# Patient Record
Sex: Female | Born: 1935 | Race: White | Hispanic: No | Smoking: Former smoker
Health system: Southern US, Community
[De-identification: ages and names within clinical notes are randomized; demographics above are authoritative.]

## PROBLEM LIST (undated history)

## (undated) DIAGNOSIS — R51 Headache: Secondary | ICD-10-CM

## (undated) DIAGNOSIS — N189 Chronic kidney disease, unspecified: Secondary | ICD-10-CM

## (undated) DIAGNOSIS — M1990 Unspecified osteoarthritis, unspecified site: Secondary | ICD-10-CM

## (undated) DIAGNOSIS — N289 Disorder of kidney and ureter, unspecified: Secondary | ICD-10-CM

## (undated) DIAGNOSIS — I499 Cardiac arrhythmia, unspecified: Secondary | ICD-10-CM

## (undated) DIAGNOSIS — C801 Malignant (primary) neoplasm, unspecified: Secondary | ICD-10-CM

## (undated) DIAGNOSIS — M65841 Other synovitis and tenosynovitis, right hand: Secondary | ICD-10-CM

## (undated) DIAGNOSIS — K219 Gastro-esophageal reflux disease without esophagitis: Secondary | ICD-10-CM

## (undated) DIAGNOSIS — R519 Headache, unspecified: Secondary | ICD-10-CM

## (undated) HISTORY — DX: Gastro-esophageal reflux disease without esophagitis: K21.9

## (undated) HISTORY — PX: JOINT REPLACEMENT: SHX530

## (undated) HISTORY — DX: Chronic kidney disease, unspecified: N18.9

## (undated) HISTORY — DX: Malignant (primary) neoplasm, unspecified: C80.1

## (undated) HISTORY — DX: Disorder of kidney and ureter, unspecified: N28.9

## (undated) HISTORY — PX: TOTAL HIP ARTHROPLASTY: SHX124

## (undated) HISTORY — PX: TONSILLECTOMY: SUR1361

## (undated) HISTORY — PX: MOUTH SURGERY: SHX715

---

## 2004-07-24 ENCOUNTER — Ambulatory Visit: Payer: Self-pay | Admitting: Family Medicine

## 2004-07-25 ENCOUNTER — Ambulatory Visit: Payer: Self-pay | Admitting: Family Medicine

## 2004-07-29 ENCOUNTER — Ambulatory Visit: Payer: Self-pay | Admitting: Family Medicine

## 2004-07-31 ENCOUNTER — Ambulatory Visit: Payer: Self-pay | Admitting: Family Medicine

## 2004-09-02 ENCOUNTER — Ambulatory Visit: Payer: Self-pay | Admitting: Family Medicine

## 2004-09-13 ENCOUNTER — Ambulatory Visit: Payer: Self-pay | Admitting: Critical Care Medicine

## 2004-09-18 ENCOUNTER — Ambulatory Visit: Payer: Self-pay | Admitting: Critical Care Medicine

## 2004-09-18 ENCOUNTER — Ambulatory Visit: Admission: RE | Admit: 2004-09-18 | Discharge: 2004-09-18 | Payer: Self-pay | Admitting: Critical Care Medicine

## 2004-09-20 ENCOUNTER — Ambulatory Visit: Payer: Self-pay | Admitting: Critical Care Medicine

## 2004-09-23 ENCOUNTER — Ambulatory Visit: Payer: Self-pay | Admitting: Pulmonary Disease

## 2004-09-26 ENCOUNTER — Ambulatory Visit: Payer: Self-pay | Admitting: Critical Care Medicine

## 2004-09-30 ENCOUNTER — Ambulatory Visit: Payer: Self-pay | Admitting: Critical Care Medicine

## 2004-11-15 ENCOUNTER — Ambulatory Visit: Payer: Self-pay | Admitting: Critical Care Medicine

## 2004-12-03 ENCOUNTER — Ambulatory Visit: Payer: Self-pay | Admitting: Family Medicine

## 2006-01-15 ENCOUNTER — Ambulatory Visit: Payer: Self-pay | Admitting: Family Medicine

## 2006-02-11 ENCOUNTER — Ambulatory Visit: Payer: Self-pay | Admitting: Family Medicine

## 2006-02-18 ENCOUNTER — Ambulatory Visit: Payer: Self-pay | Admitting: Family Medicine

## 2006-03-10 ENCOUNTER — Ambulatory Visit: Payer: Self-pay | Admitting: Family Medicine

## 2006-04-08 ENCOUNTER — Ambulatory Visit: Payer: Self-pay | Admitting: Family Medicine

## 2006-12-31 ENCOUNTER — Ambulatory Visit: Payer: Self-pay | Admitting: Family Medicine

## 2015-09-16 DIAGNOSIS — I499 Cardiac arrhythmia, unspecified: Secondary | ICD-10-CM

## 2015-09-16 HISTORY — DX: Cardiac arrhythmia, unspecified: I49.9

## 2016-01-31 DIAGNOSIS — M1990 Unspecified osteoarthritis, unspecified site: Secondary | ICD-10-CM | POA: Insufficient documentation

## 2016-07-10 ENCOUNTER — Other Ambulatory Visit: Payer: Self-pay | Admitting: Orthopedic Surgery

## 2016-07-11 ENCOUNTER — Encounter (HOSPITAL_BASED_OUTPATIENT_CLINIC_OR_DEPARTMENT_OTHER): Payer: Self-pay | Admitting: *Deleted

## 2016-07-11 ENCOUNTER — Encounter (HOSPITAL_COMMUNITY)
Admission: RE | Admit: 2016-07-11 | Discharge: 2016-07-11 | Disposition: A | Discharge status: Home / Self Care | Payer: Medicare Other | Source: Ambulatory Visit | Attending: Orthopedic Surgery | Admitting: Orthopedic Surgery

## 2016-07-14 NOTE — Progress Notes (Signed)
ekg reviewed by Dr Royce Macadamia no further treatment needed

## 2016-07-15 ENCOUNTER — Ambulatory Visit (HOSPITAL_BASED_OUTPATIENT_CLINIC_OR_DEPARTMENT_OTHER)
Admission: RE | Admit: 2016-07-15 | Discharge: 2016-07-15 | Disposition: A | Discharge status: Home / Self Care | Payer: Medicare Other | Source: Ambulatory Visit | Attending: Orthopedic Surgery | Admitting: Orthopedic Surgery

## 2016-07-15 ENCOUNTER — Ambulatory Visit (HOSPITAL_BASED_OUTPATIENT_CLINIC_OR_DEPARTMENT_OTHER): Payer: Medicare Other | Admitting: Certified Registered"

## 2016-07-15 ENCOUNTER — Encounter (HOSPITAL_BASED_OUTPATIENT_CLINIC_OR_DEPARTMENT_OTHER)
Admission: RE | Disposition: A | Discharge status: Home / Self Care | Payer: Self-pay | Source: Ambulatory Visit | Attending: Orthopedic Surgery

## 2016-07-15 ENCOUNTER — Encounter (HOSPITAL_BASED_OUTPATIENT_CLINIC_OR_DEPARTMENT_OTHER): Payer: Self-pay | Admitting: Anesthesiology

## 2016-07-15 DIAGNOSIS — M65341 Trigger finger, right ring finger: Secondary | ICD-10-CM | POA: Diagnosis not present

## 2016-07-15 HISTORY — DX: Headache: R51

## 2016-07-15 HISTORY — DX: Cardiac arrhythmia, unspecified: I49.9

## 2016-07-15 HISTORY — DX: Unspecified osteoarthritis, unspecified site: M19.90

## 2016-07-15 HISTORY — DX: Other synovitis and tenosynovitis, right hand: M65.841

## 2016-07-15 HISTORY — DX: Headache, unspecified: R51.9

## 2016-07-15 HISTORY — PX: TRIGGER FINGER RELEASE: SHX641

## 2016-07-15 SURGERY — RELEASE, A1 PULLEY, FOR TRIGGER FINGER
Anesthesia: Monitor Anesthesia Care | Site: Finger | Laterality: Right

## 2016-07-15 MED ORDER — ONDANSETRON HCL 4 MG/2ML IJ SOLN
INTRAMUSCULAR | Status: DC | PRN
  Administered 2016-07-15: 4 mg via INTRAVENOUS

## 2016-07-15 MED ORDER — CEFAZOLIN SODIUM-DEXTROSE 2-4 GM/100ML-% IV SOLN
2.0000 g | INTRAVENOUS | Status: AC
  Administered 2016-07-15: 2 g via INTRAVENOUS

## 2016-07-15 MED ORDER — MIDAZOLAM HCL 2 MG/2ML IJ SOLN
1.0000 mg | INTRAMUSCULAR | Status: DC | PRN
  Administered 2016-07-15: 1 mg via INTRAVENOUS

## 2016-07-15 MED ORDER — CHLORHEXIDINE GLUCONATE 4 % EX LIQD: 60.0000 mL | Freq: Once | CUTANEOUS | Status: DC

## 2016-07-15 MED ORDER — LACTATED RINGERS IV SOLN
INTRAVENOUS | Status: DC
  Administered 2016-07-15: 13:00:00 via INTRAVENOUS

## 2016-07-15 MED ORDER — LIDOCAINE HCL (PF) 0.5 % IJ SOLN
INTRAMUSCULAR | Status: DC | PRN
  Administered 2016-07-15: 25 mL via INTRAVENOUS

## 2016-07-15 MED ORDER — BUPIVACAINE HCL (PF) 0.5 % IJ SOLN
INTRAMUSCULAR | Status: DC | PRN
  Administered 2016-07-15: 2.5 mL

## 2016-07-15 MED ORDER — FENTANYL CITRATE (PF) 100 MCG/2ML IJ SOLN
50.0000 ug | INTRAMUSCULAR | Status: DC | PRN
  Administered 2016-07-15: 50 ug via INTRAVENOUS

## 2016-07-15 MED ORDER — GLYCOPYRROLATE 0.2 MG/ML IJ SOLN: 0.2000 mg | Freq: Once | INTRAMUSCULAR | Status: DC | PRN

## 2016-07-15 MED ORDER — FENTANYL CITRATE (PF) 100 MCG/2ML IJ SOLN: 25.0000 ug | INTRAMUSCULAR | Status: DC | PRN

## 2016-07-15 MED ORDER — HYDROCODONE-ACETAMINOPHEN 5-325 MG PO TABS
1.0000 | ORAL_TABLET | Freq: Four times a day (QID) | ORAL | 0 refills | Status: DC | PRN
Start: 2016-07-15 — End: 2020-01-26

## 2016-07-15 MED ORDER — MEPERIDINE HCL 25 MG/ML IJ SOLN: 6.2500 mg | INTRAMUSCULAR | Status: DC | PRN

## 2016-07-15 MED ORDER — FENTANYL CITRATE (PF) 100 MCG/2ML IJ SOLN
INTRAMUSCULAR | Status: AC
  Filled 2016-07-15: qty 2

## 2016-07-15 MED ORDER — SCOPOLAMINE 1 MG/3DAYS TD PT72: 1.0000 | MEDICATED_PATCH | Freq: Once | TRANSDERMAL | Status: DC | PRN

## 2016-07-15 MED ORDER — MIDAZOLAM HCL 2 MG/2ML IJ SOLN
INTRAMUSCULAR | Status: AC
  Filled 2016-07-15: qty 2

## 2016-07-15 MED ORDER — PROPOFOL 10 MG/ML IV BOLUS
INTRAVENOUS | Status: DC | PRN
  Administered 2016-07-15: 20 mg via INTRAVENOUS

## 2016-07-15 SURGICAL SUPPLY — 34 items
BANDAGE COBAN STERILE 2 (GAUZE/BANDAGES/DRESSINGS) ×3 IMPLANT
BLADE SURG 15 STRL LF DISP TIS (BLADE) ×1 IMPLANT
BLADE SURG 15 STRL SS (BLADE) ×3
BNDG CMPR 9X4 STRL LF SNTH (GAUZE/BANDAGES/DRESSINGS)
BNDG ESMARK 4X9 LF (GAUZE/BANDAGES/DRESSINGS) IMPLANT
CHLORAPREP W/TINT 26ML (MISCELLANEOUS) ×3 IMPLANT
CORDS BIPOLAR (ELECTRODE) IMPLANT
COVER BACK TABLE 60X90IN (DRAPES) ×3 IMPLANT
COVER MAYO STAND STRL (DRAPES) ×3 IMPLANT
CUFF TOURNIQUET SINGLE 18IN (TOURNIQUET CUFF) ×3 IMPLANT
DECANTER SPIKE VIAL GLASS SM (MISCELLANEOUS) IMPLANT
DRAPE EXTREMITY T 121X128X90 (DRAPE) ×3 IMPLANT
DRAPE SURG 17X23 STRL (DRAPES) ×3 IMPLANT
GAUZE SPONGE 4X4 12PLY STRL (GAUZE/BANDAGES/DRESSINGS) ×3 IMPLANT
GAUZE XEROFORM 1X8 LF (GAUZE/BANDAGES/DRESSINGS) ×3 IMPLANT
GLOVE BIO SURGEON STRL SZ7.5 (GLOVE) ×3 IMPLANT
GLOVE BIOGEL PI IND STRL 8 (GLOVE) ×1 IMPLANT
GLOVE BIOGEL PI IND STRL 8.5 (GLOVE) ×1 IMPLANT
GLOVE BIOGEL PI INDICATOR 8 (GLOVE) ×2
GLOVE BIOGEL PI INDICATOR 8.5 (GLOVE) ×2
GLOVE SURG ORTHO 8.0 STRL STRW (GLOVE) ×3 IMPLANT
GOWN STRL REUS W/ TWL LRG LVL3 (GOWN DISPOSABLE) ×1 IMPLANT
GOWN STRL REUS W/TWL 2XL LVL3 (GOWN DISPOSABLE) ×2 IMPLANT
GOWN STRL REUS W/TWL LRG LVL3 (GOWN DISPOSABLE)
GOWN STRL REUS W/TWL XL LVL3 (GOWN DISPOSABLE) ×3 IMPLANT
NEEDLE PRECISIONGLIDE 27X1.5 (NEEDLE) ×3 IMPLANT
NS IRRIG 1000ML POUR BTL (IV SOLUTION) ×3 IMPLANT
PACK BASIN DAY SURGERY FS (CUSTOM PROCEDURE TRAY) ×3 IMPLANT
STOCKINETTE 4X48 STRL (DRAPES) ×3 IMPLANT
SUT ETHILON 4 0 PS 2 18 (SUTURE) ×3 IMPLANT
SYR BULB 3OZ (MISCELLANEOUS) ×3 IMPLANT
SYR CONTROL 10ML LL (SYRINGE) ×3 IMPLANT
TOWEL OR 17X24 6PK STRL BLUE (TOWEL DISPOSABLE) ×6 IMPLANT
UNDERPAD 30X30 (UNDERPADS AND DIAPERS) ×3 IMPLANT

## 2016-07-15 NOTE — Anesthesia Postprocedure Evaluation (Signed)
Anesthesia Post Note  Patient: Loretta Miles  Procedure(s) Performed: Procedure(s) (LRB): RELEASE TRIGGER FINGER/A-1 PULLEY RIGHT RING FINGER (Right)  Patient location during evaluation: PACU Anesthesia Type: MAC Level of consciousness: awake and alert Pain management: pain level controlled Vital Signs Assessment: post-procedure vital signs reviewed and stable Respiratory status: spontaneous breathing, nonlabored ventilation and respiratory function stable Cardiovascular status: stable and blood pressure returned to baseline Anesthetic complications: no    Last Vitals:  Vitals:   07/15/16 1400 07/15/16 1430  BP: (!) 116/99 (!) 164/82  Pulse: 65 75  Resp: 16 16  Temp:  36.4 C    Last Pain:  Vitals:   07/15/16 1430  TempSrc:   PainSc: 0-No pain                 Janaria Mccammon A

## 2016-07-15 NOTE — H&P (Signed)
Loretta Miles is an 82 y.o. female.   Chief Complaint: catching right ring finger HPI: Loretta Miles is an 82 year old right hand dominant female referred from Dr Edrick Oh for consultation with pain in her right ring finger catching. This has been going on for the past six years, getting increasingly worse with VAS score 10/10. Sharp pain is noted in the palm. She has tried coconut oil, which gave her some relief. She states nothing seems to make it better or worse. She has no history of diabetes, thyroid problems. She does have a history of arthritis. There is no history of gout.This has been injected without resolution.  FAMILY HISTORY: Negative for diabetes, positive for possible thyroid problems. Negative for arthritis and negative for gout. She complains that she has pain at the carpometacarpal joint of her thumb.  History reviewed. No pertinent past medical history.  History reviewed. No pertinent surgical history.      Past Medical History:  Diagnosis Date  . Arthritis    rt hip, lt knee  . Dysrhythmia 2017   episode of RAF  . Headache    rare migraines  . 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 TONSILLECTOMY      History reviewed. No pertinent family history. Social History:  reports that she has never smoked. She has never used smokeless tobacco. She reports that she does not drink alcohol or use drugs.  Allergies:  Allergies  Allergen Reactions  . Antihistamines, Chlorpheniramine-Type Other (See Comments)    BP went up  . Prednisone Other (See Comments)    Caused "me to be crazy"  . Tamiflu [Oseltamivir] Other (See Comments)    Caused heart to be irreg, bp to go up    No prescriptions prior to admission.    No results found for this or any previous visit (from the past 48 hour(s)).  No results found.   Pertinent items are noted in HPI.  Height 5\' 6"  (1.676 m), weight 68 kg (150  lb).  General appearance: alert, cooperative and appears stated age Head: Normocephalic, without obvious abnormality Neck: no JVD Resp: clear to auscultation bilaterally Cardio: regular rate and rhythm, S1, S2 normal, no murmur, click, rub or gallop GI: soft, non-tender; bowel sounds normal; no masses,  no organomegaly Extremities: catching right ring finger Pulses: 2+ and symmetric Skin: Skin color, texture, turgor normal. No rashes or lesions Neurologic: Grossly normal Incision/Wound: na  Assessment/Plan Trigger finger right ring finger Plan A-1 pulley release right ring finger  George Haggart R 07/15/2016, 11:06 AM

## 2016-07-15 NOTE — Brief Op Note (Signed)
07/15/2016  1:42 PM  PATIENT:  Loretta Miles  82 y.o. female  PRE-OPERATIVE DIAGNOSIS:  stenosing tenosynovitis right ring finger #M65.341  POST-OPERATIVE DIAGNOSIS:  stenosing tenosynovitis right ring finger #M65.341  PROCEDURE:  Procedure(s): RELEASE TRIGGER FINGER/A-1 PULLEY RIGHT RING FINGER (Right)  SURGEON:  Surgeon(s) and Role:    * Daryll Brod, MD - Primary  PHYSICIAN ASSISTANT:   ASSISTANTS: none   ANESTHESIA:   local and regional  EBL:  Total I/O In: 700 [I.V.:700] Out: 0   BLOOD ADMINISTERED:none  DRAINS: none   LOCAL MEDICATIONS USED:  BUPIVICAINE   SPECIMEN:  No Specimen  DISPOSITION OF SPECIMEN:  N/A  COUNTS:  YES  TOURNIQUET:   Total Tourniquet Time Documented: Forearm (Right) - 19 minutes Total: Forearm (Right) - 19 minutes   DICTATION: .Other Dictation: Dictation Number [id number]  PLAN OF CARE: Discharge to home after PACU  PATIENT DISPOSITION:  PACU - hemodynamically stable.

## 2016-07-15 NOTE — Anesthesia Preprocedure Evaluation (Signed)
Anesthesia Evaluation  Patient identified by MRN, date of birth, ID band Patient awake    Reviewed: Allergy & Precautions, NPO status , Patient's Chart, lab work & pertinent test results  Airway Mallampati: I  TM Distance: >3 FB Neck ROM: Full    Dental  (+) Teeth Intact, Upper Dentures, Dental Advisory Given   Pulmonary    breath sounds clear to auscultation       Cardiovascular  Rhythm:Regular Rate:Normal     Neuro/Psych    GI/Hepatic   Endo/Other    Renal/GU      Musculoskeletal   Abdominal   Peds  Hematology   Anesthesia Other Findings   Reproductive/Obstetrics                             Anesthesia Physical Anesthesia Plan  ASA: I  Anesthesia Plan: MAC and Bier Block   Post-op Pain Management:    Induction: Intravenous  Airway Management Planned: Simple Face Mask  Additional Equipment:   Intra-op Plan:   Post-operative Plan:   Informed Consent: I have reviewed the patients History and Physical, chart, labs and discussed the procedure including the risks, benefits and alternatives for the proposed anesthesia with the patient or authorized representative who has indicated his/her understanding and acceptance.   Dental advisory given  Plan Discussed with: CRNA, Anesthesiologist and Surgeon  Anesthesia Plan Comments:         Anesthesia Quick Evaluation

## 2016-07-15 NOTE — Discharge Instructions (Addendum)

## 2016-07-15 NOTE — Transfer of Care (Signed)
Immediate Anesthesia Transfer of Care Note  Patient: Loretta Miles  Procedure(s) Performed: Procedure(s): RELEASE TRIGGER FINGER/A-1 PULLEY RIGHT RING FINGER (Right)  Patient Location: PACU  Anesthesia Type:MAC and Bier block  Level of Consciousness: awake, alert  and oriented  Airway & Oxygen Therapy: Patient Spontanous Breathing and Patient connected to face mask oxygen  Post-op Assessment: Report given to RN and Post -op Vital signs reviewed and stable  Post vital signs: Reviewed and stable  Last Vitals:  Vitals:   07/15/16 1241  BP: (!) 181/77  Pulse: 60  Resp: 18  Temp: 37.1 C    Last Pain:  Vitals:   07/15/16 1241  TempSrc: Oral         Complications: No apparent anesthesia complications

## 2016-07-15 NOTE — Op Note (Signed)
Dictation Number [id number]

## 2016-07-16 ENCOUNTER — Encounter (HOSPITAL_BASED_OUTPATIENT_CLINIC_OR_DEPARTMENT_OTHER): Payer: Self-pay | Admitting: Orthopedic Surgery

## 2016-07-16 NOTE — Op Note (Signed)
Loretta Miles, Loretta Miles                ACCOUNT NO.:  [id number]  MEDICAL RECORD NO.:  [id number]  LOCATION:                                 FACILITY:  PHYSICIAN:  Daryll Brod, M.D.       DATE OF BIRTH:  Jul 31, 1931  DATE OF PROCEDURE:  07/15/2016 DATE OF DISCHARGE:                              OPERATIVE REPORT   PREOPERATIVE NOTE:  Stenosing tenosynovitis, right ring finger.  POSTOPERATIVE DIAGNOSIS:  Stenosing tenosynovitis, right ring finger.  OPERATION:  Release of A1 pulley, right ring finger.  SURGEON:  Daryll Brod, MD.  ANESTHESIA:  Forearm-based IV regional with local infiltration.  ANESTHESIOLOGIST:  [place].  HISTORY:  The patient is an 82 year old female with a history of triggering of her right ring finger.  This has not responded to conservative treatment.  She has elected to undergo surgical release of the A1 pulley.  Pre, peri, and postoperative courses have been discussed along with risks and complications.  She is aware that there is no guarantee to the surgery; the possibility of infection; recurrence of injury to arteries, nerves, tendons; and incomplete relief of symptoms. In the preoperative area, the patient is seen, the extremity marked by both patient and surgeon.  Antibiotic given.  PROCEDURE IN DETAIL:  The patient was brought to the operating room, where a forearm-based IV regional anesthetic was carried out without difficulty under the direction of Dr. Al Corpus.  She was prepped using ChloraPrep, supine position with the right arm free.  A 3-minute dry time was allowed and time-out taken, confirming the patient and procedure.  An oblique incision was made over the A1 pulley of the right ring finger.  This carried down through subcutaneous tissue.  Bleeders were electrocauterized as necessary.  The A1 pulley was then released on its radial aspect.  A small incision made centrally in A2.  The superficialis tendon had marked abrasions across the palmar aspect,  this was debrided with sharp dissection.  The tenosynovial tissue proximally was then separated and a partial tenosynovectomy performed.  The two tendons were then separated to remove any adhesions between the two. The finger was placed through full flexion, no further triggering was noted.  The wound was irrigated with saline.  The skin was then closed with interrupted 4-0 nylon sutures.  A local infiltration with 0.25% bupivacaine without epinephrine was given.  A sterile compressive dressing with the fingers free was applied.  On deflation of the tourniquet, all fingers immediately pinked.  She was taken to the recovery room for observation in satisfactory condition.  Prior to placement of the dressing, local infiltration with 50% bupivacaine without epinephrine was given, approximately 3 mL was used.  The patient tolerated the procedure well.  She will be discharged to home to return to the [place] in 1 week, on Norco.          ______________________________ Daryll Brod, M.D.     GK/MEDQ  D:  07/15/2016  T:  07/16/2016  Job:  [id number]

## 2016-09-21 ENCOUNTER — Emergency Department (HOSPITAL_COMMUNITY): Payer: Medicare Other

## 2016-09-21 ENCOUNTER — Encounter (HOSPITAL_COMMUNITY): Payer: Self-pay | Admitting: Emergency Medicine

## 2016-09-21 ENCOUNTER — Emergency Department (HOSPITAL_COMMUNITY)
Admission: EM | Admit: 2016-09-21 | Discharge: 2016-09-21 | Disposition: A | Discharge status: Home / Self Care | Payer: Medicare Other | Attending: Emergency Medicine | Admitting: Emergency Medicine

## 2016-09-21 DIAGNOSIS — J4521 Mild intermittent asthma with (acute) exacerbation: Secondary | ICD-10-CM

## 2016-09-21 DIAGNOSIS — R05 Cough: Secondary | ICD-10-CM | POA: Diagnosis present

## 2016-09-21 DIAGNOSIS — T17908A Unspecified foreign body in respiratory tract, part unspecified causing other injury, initial encounter: Secondary | ICD-10-CM

## 2016-09-21 MED ORDER — IPRATROPIUM-ALBUTEROL 0.5-2.5 (3) MG/3ML IN SOLN
3.0000 mL | Freq: Once | RESPIRATORY_TRACT | Status: AC
  Administered 2016-09-21: 3 mL via RESPIRATORY_TRACT
  Filled 2016-09-21: qty 3

## 2016-09-21 MED ORDER — ALBUTEROL SULFATE HFA 108 (90 BASE) MCG/ACT IN AERS
1.0000 | INHALATION_SPRAY | RESPIRATORY_TRACT | Status: DC | PRN
  Administered 2016-09-21: 2 via RESPIRATORY_TRACT
  Filled 2016-09-21: qty 6.7

## 2016-09-21 MED ORDER — AEROCHAMBER PLUS FLO-VU MEDIUM MISC
1.0000 | Freq: Once | Status: AC
  Administered 2016-09-21: 1
  Filled 2016-09-21: qty 1

## 2016-09-21 NOTE — ED Provider Notes (Signed)
[facility] DEPT Provider Note   CSN: [id number] Arrival date & time: 09/21/16  1230   By signing my name below, I, Hilbert Odor, attest that this documentation has been prepared under the direction and in the presence of Isla Pence, MD. Electronically Signed: Hilbert Odor, Scribe. 09/21/16. 1:16 PM. History   Chief Complaint Chief Complaint  Patient presents with  . Aspiration    food went down the wrong way    The history is provided by the patient. No language interpreter was used.   HPI Comments: Loretta Miles is a 83 y.o. female who presents to the Emergency Department complaining of aspiration that occurred around 10 am today. Patient reports that she was eating breakfast this morning when she believes that some of her food went down the wrong way. Patient reports a persistent cough since this occurred. She has not ate anything since this occurrence. She denies recent hx of smoking. Past Medical History:  Diagnosis Date  . Arthritis    rt hip, lt knee  . Dysrhythmia 2017   episode of RAF  . Headache    rare migraines  . 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   Past Surgical History:  Procedure Laterality Date  . TONSILLECTOMY    . TRIGGER FINGER RELEASE Right 07/15/2016   Procedure: RELEASE TRIGGER FINGER/A-1 PULLEY RIGHT RING FINGER;  Surgeon: Daryll Brod, MD;  Location: [place];  Service: Orthopedics;  Laterality: Right;    OB History    No data available       Home Medications    Prior to Admission medications   Medication Sig Start Date End Date Taking? Authorizing Provider  HYDROcodone-acetaminophen (NORCO) 5-325 MG tablet Take 1 tablet by mouth every 6 (six) hours as needed for moderate pain. 07/15/16   Daryll Brod, MD    Family History No family history on file.  Social History Social History  Substance Use Topics  . Smoking  status: Never Smoker  . Smokeless tobacco: Never Used  . Alcohol use No     Allergies   Antihistamines, chlorpheniramine-type; Prednisone; and Tamiflu [oseltamivir]   Review of Systems Review of Systems  Respiratory: Positive for cough (Persistent) and shortness of breath.   Cardiovascular: Negative for chest pain.  Gastrointestinal: Negative for abdominal pain.  All other systems reviewed and are negative.    Physical Exam Updated Vital Signs BP 182/85 (BP Location: Left Arm)   Pulse 89   Temp 97.8 F (36.6 C) (Oral)   Resp 18   Ht 5\' 6"  (1.676 m)   Wt 148 lb (67.1 kg)   SpO2 99%   BMI 23.89 kg/m   Physical Exam  Constitutional: She is oriented to person, place, and time. She appears well-nourished. No distress.  HENT:  Head: Normocephalic and atraumatic.  Eyes: EOM are normal. Pupils are equal, round, and reactive to light.  Neck: Normal range of motion. Neck supple.  Cardiovascular: Normal rate and regular rhythm.  Exam reveals no gallop and no friction rub.   No murmur heard. Pulmonary/Chest: She has wheezes (Right Side). She has no rhonchi. She has no rales.  Abdominal: Soft. Bowel sounds are normal. There is no tenderness.  Musculoskeletal: Normal range of motion. She exhibits no tenderness.  Neurological: She is alert and oriented to person, place, and time.  Skin: Skin is warm and dry.  Psychiatric: She has a  normal mood and affect.  Nursing note and vitals reviewed.    ED Treatments / Results  DIAGNOSTIC STUDIES: Oxygen Saturation is 99% on RA, normal by my interpretation.    COORDINATION OF CARE: 1:06 PM Discussed treatment plan with pt at bedside and pt agreed to plan.  Labs (all labs ordered are listed, but only abnormal results are displayed) Labs Reviewed - No data to display  EKG  EKG Interpretation None       Radiology No results found.  Procedures Procedures (including critical care time)  Medications Ordered in  ED Medications - No data to display   Initial Impression / Assessment and Plan / ED Course  I have reviewed the triage vital signs and the nursing notes.  Pertinent labs & imaging results that were available during my care of the patient were reviewed by me and considered in my medical decision making (see chart for details).  Clinical Course     Pt given 1 duoneb and sx resolved.  As pt is feeling better, no need to explore further.  She is given an albuterol inhaler for home.  She knows to return if worse and to f/u with pulmonology.  Final Clinical Impressions(s) / ED Diagnoses   Final diagnoses:  None    New Prescriptions New Prescriptions   No medications on file   I personally performed the services described in this documentation, which was scribed in my presence. The recorded information has been reviewed and is accurate.    Isla Pence, MD 09/21/16 [phone]

## 2016-09-21 NOTE — ED Triage Notes (Signed)
Pt states that she was eating brown rice for breakfast this morning and the rice went down the wrong way.  Pt states that she has been coughing every since this happened.  Pt is breathing freely and able to communicate her needs at this time.

## 2019-02-12 ENCOUNTER — Other Ambulatory Visit: Payer: Self-pay

## 2019-02-12 ENCOUNTER — Encounter (HOSPITAL_COMMUNITY): Payer: Self-pay

## 2019-02-12 ENCOUNTER — Emergency Department (HOSPITAL_COMMUNITY)
Admission: EM | Admit: 2019-02-12 | Discharge: 2019-02-12 | Disposition: A | Discharge status: Home / Self Care | Payer: Medicare Other | Attending: Emergency Medicine | Admitting: Emergency Medicine

## 2019-02-12 DIAGNOSIS — W57XXXA Bitten or stung by nonvenomous insect and other nonvenomous arthropods, initial encounter: Secondary | ICD-10-CM | POA: Diagnosis not present

## 2019-02-12 DIAGNOSIS — S30860A Insect bite (nonvenomous) of lower back and pelvis, initial encounter: Secondary | ICD-10-CM | POA: Diagnosis not present

## 2019-02-12 DIAGNOSIS — Z79899 Other long term (current) drug therapy: Secondary | ICD-10-CM | POA: Diagnosis not present

## 2019-02-12 DIAGNOSIS — S30850A Superficial foreign body of lower back and pelvis, initial encounter: Secondary | ICD-10-CM | POA: Insufficient documentation

## 2019-02-12 DIAGNOSIS — Y929 Unspecified place or not applicable: Secondary | ICD-10-CM | POA: Diagnosis not present

## 2019-02-12 DIAGNOSIS — Y9389 Activity, other specified: Secondary | ICD-10-CM | POA: Diagnosis not present

## 2019-02-12 DIAGNOSIS — Y999 Unspecified external cause status: Secondary | ICD-10-CM | POA: Diagnosis not present

## 2019-02-12 DIAGNOSIS — Z96649 Presence of unspecified artificial hip joint: Secondary | ICD-10-CM | POA: Diagnosis not present

## 2019-02-12 MED ORDER — DOXYCYCLINE HYCLATE 100 MG PO CAPS: 100.0000 mg | ORAL_CAPSULE | Freq: Two times a day (BID) | ORAL | 0 refills | Status: DC

## 2019-02-12 NOTE — Discharge Instructions (Addendum)
We successfully removed a tick from your right buttocks area.  Your vital signs are within normal limits with exception of your blood pressure being slightly elevated.  Please have this rechecked soon.  Please use the doxycycline 2 times daily with food.  Please see Dr. Leonarda Salon if your symptoms are increasing.  In particular if you have any bull's-eye type rash, fever, nausea vomiting, unusual headache, or deterioration in your general condition.

## 2019-02-12 NOTE — ED Triage Notes (Signed)
Pt reports noticing a tick on right upper buttocks yesterday apprx 5pm

## 2019-02-12 NOTE — ED Provider Notes (Signed)
[facility] EMERGENCY DEPARTMENT Provider Note   CSN: [id number] Arrival date & time: 02/12/19  9518    History   Chief Complaint Chief Complaint  Patient presents with  . Tick Removal    HPI Loretta Miles is a 85 y.o. female.     Patient is an 85 year old female who presents to the emergency department with a complaint of a tick bite to the right buttocks.  The patient states that on yesterday, May 29 she noticed the tick.  She applied Vaseline over the tick hoping that it would back out on its own, but it did not.  Today she noticed some increased redness around the tick bite area and came to the emergency department for evaluation.  Patient states that she has a daughter who was diagnosed with Lyme disease a few years ago because of tick bite and she became very concerned about her tick bite.  No fever, no chills, no rash, no unusual headache, no other problems reported at this time.     Past Medical History:  Diagnosis Date  . Arthritis    rt hip, lt knee  . Dysrhythmia 2017   episode of RAF  . Headache    rare migraines  . 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   Past Surgical History:  Procedure Laterality Date  . JOINT REPLACEMENT     hip replacement  . TONSILLECTOMY    . TRIGGER FINGER RELEASE Right 07/15/2016   Procedure: RELEASE TRIGGER FINGER/A-1 PULLEY RIGHT RING FINGER;  Surgeon: Daryll Brod, MD;  Location: [place];  Service: Orthopedics;  Laterality: Right;     OB History   No obstetric history on file.      Home Medications    Prior to Admission medications   Medication Sig Start Date End Date Taking? Authorizing Provider  HYDROcodone-acetaminophen (NORCO) 5-325 MG tablet Take 1 tablet by mouth every 6 (six) hours as needed for moderate pain. 07/15/16   Daryll Brod, MD  vitamin C (ASCORBIC ACID) 250 MG tablet Take 1,000 mg by mouth daily.      [provider]  zinc gluconate 50 MG tablet Take 50 mg by mouth daily.    [provider]    Family History No family history on file.  Social History Social History   Tobacco Use  . Smoking status: Never Smoker  . Smokeless tobacco: Never Used  Substance Use Topics  . Alcohol use: No  . Drug use: No     Allergies   Cortisone; Albuterol sulfate; Antihistamines, chlorpheniramine-type; Chocolate; Cocoa; Coconut flavor; Mint chocolate chip flavor; Prednisone; Tamiflu [oseltamivir]; and Tetanus toxoids   Review of Systems Review of Systems  Constitutional: Negative for activity change.       All ROS Neg except as noted in HPI  HENT: Negative.   Eyes: Negative for photophobia and discharge.  Respiratory: Negative for cough, shortness of breath and wheezing.   Cardiovascular: Negative for chest pain and palpitations.  Gastrointestinal: Negative for abdominal pain and blood in stool.  Genitourinary: Negative for dysuria, frequency and hematuria.  Musculoskeletal: Negative for arthralgias, back pain and neck pain.  Skin: Negative.   Neurological: Negative for dizziness, seizures and speech difficulty.  Psychiatric/Behavioral: Negative for confusion and hallucinations.     Physical Exam Updated Vital Signs BP (!) 162/92 (BP Location: Left Arm)   Pulse 77   Temp  98.6 F (37 C) (Oral)   Resp 18   Ht 5\' 6"  (1.676 m)   Wt 63.5 kg   SpO2 98%   BMI 22.60 kg/m   Physical Exam Vitals signs and nursing note reviewed.  Constitutional:      Appearance: She is well-developed. She is not toxic-appearing.  HENT:     Head: Normocephalic.     Right Ear: Tympanic membrane and external ear normal.     Left Ear: Tympanic membrane and external ear normal.  Eyes:     General: Lids are normal.     Pupils: Pupils are equal, round, and reactive to light.  Neck:     Musculoskeletal: Normal range of motion and neck supple.     Vascular: No carotid bruit.   Cardiovascular:     Rate and Rhythm: Normal rate and regular rhythm.     Pulses: Normal pulses.     Heart sounds: Normal heart sounds.  Pulmonary:     Effort: No respiratory distress.     Breath sounds: Normal breath sounds.  Abdominal:     General: Bowel sounds are normal.     Palpations: Abdomen is soft.     Tenderness: There is no abdominal tenderness. There is no guarding.  Musculoskeletal: Normal range of motion.  Lymphadenopathy:     Head:     Right side of head: No submandibular adenopathy.     Left side of head: No submandibular adenopathy.     Cervical: No cervical adenopathy.  Skin:    General: Skin is warm and dry.       Neurological:     Mental Status: She is alert and oriented to person, place, and time.     Cranial Nerves: No cranial nerve deficit.     Sensory: No sensory deficit.  Psychiatric:        Speech: Speech normal.      ED Treatments / Results  Labs (all labs ordered are listed, but only abnormal results are displayed) Labs Reviewed - No data to display  EKG None  Radiology No results found.  Procedures .Foreign Body Removal Date/Time: 02/12/2019 12:09 PM Performed by: Lily Kocher, PA-C Authorized by: Lily Kocher, PA-C  Consent: Verbal consent obtained. Risks and benefits: risks, benefits and alternatives were discussed Consent given by: patient Patient understanding: patient states understanding of the procedure being performed Patient identity confirmed: arm band Time out: Immediately prior to procedure a "time out" was called to verify the correct patient, procedure, equipment, support staff and site/side marked as required. Intake: right buttox.  Anesthesia: Local Anesthetic: topical anesthetic  Sedation: Patient sedated: no  Patient restrained: no Patient cooperative: yes Complexity: simple 1 objects recovered. Objects recovered: Tick Post-procedure assessment: foreign body removed Patient tolerance: Patient  tolerated the procedure well with no immediate complications   (including critical care time)  Medications Ordered in ED Medications - No data to display   Initial Impression / Assessment and Plan / ED Course  I have reviewed the triage vital signs and the nursing notes.  Pertinent labs & imaging results that were available during my care of the patient were reviewed by me and considered in my medical decision making (see chart for details).          Final Clinical Impressions(s) / ED Diagnoses MDM Pt presents to ED with FB - tick in the skin. Tick removed completely. Pt placed on Doxycycline. Pt to return to the ED if any changes or problem.   Final  diagnoses:  Tick bite, initial encounter    ED Discharge Orders         Ordered    doxycycline (VIBRAMYCIN) 100 MG capsule  2 times daily     02/12/19 1201           Lily Kocher, PA-C 02/13/19 2102    Francine Graven, DO 02/16/19 [phone]

## 2019-02-12 NOTE — ED Notes (Signed)
Instructed pt to take all of antibiotics as prescribed. 

## 2019-02-12 NOTE — ED Notes (Signed)
Pt noted to have small tick to right upper buttocks. Surrounding skin red

## 2019-03-20 ENCOUNTER — Other Ambulatory Visit: Payer: Self-pay

## 2019-03-20 ENCOUNTER — Emergency Department (HOSPITAL_COMMUNITY)
Admission: EM | Admit: 2019-03-20 | Discharge: 2019-03-20 | Disposition: A | Discharge status: Home / Self Care | Payer: Medicare Other | Attending: Emergency Medicine | Admitting: Emergency Medicine

## 2019-03-20 ENCOUNTER — Encounter (HOSPITAL_COMMUNITY): Payer: Self-pay | Admitting: *Deleted

## 2019-03-20 DIAGNOSIS — Y939 Activity, unspecified: Secondary | ICD-10-CM | POA: Diagnosis not present

## 2019-03-20 DIAGNOSIS — S20162A Insect bite (nonvenomous) of breast, left breast, initial encounter: Secondary | ICD-10-CM | POA: Diagnosis present

## 2019-03-20 DIAGNOSIS — Y929 Unspecified place or not applicable: Secondary | ICD-10-CM | POA: Diagnosis not present

## 2019-03-20 DIAGNOSIS — Z96649 Presence of unspecified artificial hip joint: Secondary | ICD-10-CM | POA: Diagnosis not present

## 2019-03-20 DIAGNOSIS — W57XXXA Bitten or stung by nonvenomous insect and other nonvenomous arthropods, initial encounter: Secondary | ICD-10-CM | POA: Diagnosis not present

## 2019-03-20 DIAGNOSIS — Z79899 Other long term (current) drug therapy: Secondary | ICD-10-CM | POA: Diagnosis not present

## 2019-03-20 DIAGNOSIS — Y999 Unspecified external cause status: Secondary | ICD-10-CM | POA: Insufficient documentation

## 2019-03-20 MED ORDER — DOXYCYCLINE HYCLATE 100 MG PO CAPS: 100.0000 mg | ORAL_CAPSULE | Freq: Two times a day (BID) | ORAL | 0 refills | Status: DC

## 2019-03-20 NOTE — ED Notes (Signed)
KS in to remove tick

## 2019-03-20 NOTE — ED Notes (Signed)
Pt reports tick is imbedded under L breast  Here for tick removal

## 2019-03-20 NOTE — ED Notes (Signed)
Tick removed by KS, PA

## 2019-03-20 NOTE — Discharge Instructions (Addendum)
Return if any problems.

## 2019-03-20 NOTE — ED Provider Notes (Signed)
[facility] EMERGENCY DEPARTMENT Provider Note   CSN: [id number] Arrival date & time: 03/20/19  2036     History   Chief Complaint Chief Complaint  Patient presents with  . Tick Removal    HPI Loretta Miles is a 85 y.o. female.     Patient complains of a tick under her left breast.  Patient reports she noticed tonight when she was changing clothes.  Patient denies fever or chills she has not had any rash.  Denies seeing the tick before today  The history is provided by the patient. No language interpreter was used.  Rash Context: insect bite/sting   Relieved by:  Nothing Worsened by:  Nothing Ineffective treatments:  None tried Associated symptoms: no nausea     Past Medical History:  Diagnosis Date  . Arthritis    rt hip, lt knee  . Dysrhythmia 2017   episode of RAF  . Headache    rare migraines  . 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   Past Surgical History:  Procedure Laterality Date  . JOINT REPLACEMENT     hip replacement  . TONSILLECTOMY    . TRIGGER FINGER RELEASE Right 07/15/2016   Procedure: RELEASE TRIGGER FINGER/A-1 PULLEY RIGHT RING FINGER;  Surgeon: Daryll Brod, MD;  Location: [place];  Service: Orthopedics;  Laterality: Right;     OB History   No obstetric history on file.      Home Medications    Prior to Admission medications   Medication Sig Start Date End Date Taking? Authorizing Provider  doxycycline (VIBRAMYCIN) 100 MG capsule Take 1 capsule (100 mg total) by mouth 2 (two) times daily. 03/20/19   Fransico Meadow, PA-C  HYDROcodone-acetaminophen (NORCO) 5-325 MG tablet Take 1 tablet by mouth every 6 (six) hours as needed for moderate pain. 07/15/16   Daryll Brod, MD  vitamin C (ASCORBIC ACID) 250 MG tablet Take 1,000 mg by mouth daily.     [provider]  zinc gluconate 50 MG tablet Take 50 mg by mouth daily.     [provider]    Family History History reviewed. No pertinent family history.  Social History Social History   Tobacco Use  . Smoking status: Never Smoker  . Smokeless tobacco: Never Used  Substance Use Topics  . Alcohol use: No  . Drug use: No     Allergies   Cortisone; Albuterol sulfate; Antihistamines, chlorpheniramine-type; Chocolate; Cocoa; Coconut flavor; Mint chocolate chip flavor; Prednisone; Tamiflu [oseltamivir]; and Tetanus toxoids   Review of Systems Review of Systems  Gastrointestinal: Negative for nausea.  Skin: Positive for rash.  All other systems reviewed and are negative.    Physical Exam Updated Vital Signs BP (!) 195/88 (BP Location: Right Arm)   Pulse 66   Temp 98 F (36.7 C) (Oral)   Resp 18   SpO2 98%   Physical Exam Vitals signs and nursing note reviewed.  Constitutional:      Appearance: She is well-developed.  HENT:     Head: Normocephalic.  Neck:     Musculoskeletal: Normal range of motion.  Cardiovascular:     Rate and Rhythm: Normal rate.     Comments: Small tic left chest under left breast. Pulmonary:     Effort: Pulmonary effort is normal.  Abdominal:     General: There is no distension.  Musculoskeletal: Normal range  of motion.  Skin:    General: Skin is warm.  Neurological:     Mental Status: She is alert and oriented to person, place, and time.    MDM tick removed with forceps.  I have visualized tick under magnification.  All parts are present.  None sign of other ticks.  patient is requesting treatment with doxycycline.  She reports this is her fourth tick this year and she has taken doxycycline with all 3 previous exposures and iis concerned about Lyme disease.  I advised the patient I do not recommend taking doxycycline just due to tick exposure.  Patient is adamant that this is what Dr. Edrick Oh does.  I explained to patient risk off patients due to multiple antibiotics and patient reports understands and  continues to wish for prescription for doxycycline.  ED Treatments / Results  Labs (all labs ordered are listed, but only abnormal results are displayed) Labs Reviewed - No data to display  EKG None  Radiology No results found.  Procedures Procedures (including critical care time)  Medications Ordered in ED Medications - No data to display   Initial Impression / Assessment and Plan / ED Course  I have reviewed the triage vital signs and the nursing notes.  Pertinent labs & imaging results that were available during my care of the patient were reviewed by me and considered in my medical decision making (see chart for details).          Final Clinical Impressions(s) / ED Diagnoses   Final diagnoses:  Tick bite with subsequent removal of tick    ED Discharge Orders         Ordered    doxycycline (VIBRAMYCIN) 100 MG capsule  2 times daily     03/20/19 2140        An After Visit Summary was printed and given to the patient.    Sidney Ace 03/20/19 2227    Milton Ferguson, MD 03/23/19 [phone]

## 2019-03-20 NOTE — ED Triage Notes (Signed)
Pt states she has a tick under her left breast

## 2020-01-03 ENCOUNTER — Ambulatory Visit: Payer: Self-pay | Admitting: Family Medicine

## 2020-01-26 ENCOUNTER — Ambulatory Visit (INDEPENDENT_AMBULATORY_CARE_PROVIDER_SITE_OTHER): Payer: Medicare Other | Admitting: Family Medicine

## 2020-01-26 ENCOUNTER — Other Ambulatory Visit: Payer: Self-pay

## 2020-01-26 ENCOUNTER — Encounter: Payer: Self-pay | Admitting: Family Medicine

## 2020-01-26 VITALS — BP 138/74 | HR 69 | Temp 96.7°F | Ht 66.0 in | Wt 142.0 lb

## 2020-01-26 DIAGNOSIS — N1831 Chronic kidney disease, stage 3a: Secondary | ICD-10-CM

## 2020-01-26 DIAGNOSIS — E785 Hyperlipidemia, unspecified: Secondary | ICD-10-CM | POA: Diagnosis not present

## 2020-01-26 NOTE — Progress Notes (Signed)
New Patient Office Visit  Assessment & Plan:  1. Dyslipidemia - CBC with Differential/Platelet; Future - Lipid panel; Future  2. Stage 3a chronic kidney disease - CBC with Differential/Platelet; Future - CMP14+EGFR; Future   Follow-up: Return as directed after labs result.   Hendricks Limes, MSN, APRN, FNP-C [facility]  Subjective:  Patient ID: Loretta Miles, female    DOB: 08-02-1931  Age: 86 y.o. MRN: [id number]  Patient Care Team: Loman Brooklyn, FNP as PCP - General (Family Medicine)  CC:  Chief Complaint  Patient presents with  . New Patient (Initial Visit)    [place]  . Establish Care    HPI Loretta Miles presents to establish care. She is transferring care from Dr. Murrell Redden office as he has retired and the office has closed.   Patient has no complaints or concerns today.   Review of Systems  Constitutional: Negative for chills, fever, malaise/fatigue and weight loss.  HENT: Negative for congestion, ear discharge, ear pain, nosebleeds, sinus pain, sore throat and tinnitus.   Eyes: Negative for blurred vision, double vision, pain, discharge and redness.  Respiratory: Negative for cough, shortness of breath and wheezing.   Cardiovascular: Negative for chest pain, palpitations and leg swelling.  Gastrointestinal: Negative for abdominal pain, constipation, diarrhea, heartburn, nausea and vomiting.  Genitourinary: Negative for dysuria, frequency and urgency.  Musculoskeletal: Positive for joint pain. Negative for myalgias.  Skin: Negative for rash.  Neurological: Negative for dizziness, seizures, weakness and headaches.  Psychiatric/Behavioral: Negative for depression, substance abuse and suicidal ideas. The patient is not nervous/anxious.    No current outpatient medications on file.  Allergies  Allergen Reactions  . Albuterol Other (See Comments)    Experienced severe chest tightness and difficulty breathing.  Experienced severe  chest tightness and difficulty breathing.    . Coconut Oil Other (See Comments)    CAUSES ACID REFLUX AND VISION PROBLEMS CAUSES ACID REFLUX AND VISION PROBLEMS   . Cortisone Other (See Comments)    High dose  . Albuterol Sulfate   . Antihistamines, Chlorpheniramine-Type Other (See Comments)    BP went up  . Chocolate   . Cocoa   . Coconut Flavor   . Mint Chocolate Chip Flavor   . Prednisone Other (See Comments)    Caused "me to be crazy"  . Tamiflu [Oseltamivir] Other (See Comments)    Caused heart to be irreg, bp to go up  . Tetanus Toxoids   . Hydrocodone Nausea Only    Past Medical History:  Diagnosis Date  . Arthritis    rt hip, lt knee  . Cancer ([place])    skin  . Dysrhythmia 2017   episode of RAF  . GERD (gastroesophageal reflux disease)   . Headache    rare migraines  . Kidney disease   . 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 JOINT REPLACEMENT     hip replacement  . MOUTH SURGERY    . TONSILLECTOMY    . TOTAL HIP ARTHROPLASTY Right   . TRIGGER FINGER RELEASE Right 07/15/2016   Procedure: RELEASE TRIGGER FINGER/A-1 PULLEY RIGHT RING FINGER;  Surgeon: Daryll Brod, MD;  Location: [place];  Service: Orthopedics;  Laterality: Right;    Family History  Problem Relation Age of Onset  . Heart disease Mother   . Thyroid disease Mother   . Lung cancer Father   .  Breast cancer Sister   . Anxiety disorder Sister   . Depression Sister   . Thyroid disease Sister   . Bipolar disorder Daughter   . Anxiety disorder Daughter   . Depression Daughter   . Throat cancer Maternal Uncle   . Prostate cancer Maternal Uncle   . Lung cancer Paternal Uncle     Social History   Socioeconomic History  . Marital status: Divorced    Spouse name: Not on file  . Number of children: Not on file  . Years of education: Not on file  . Highest education level: Not on file    Occupational History  . Not on file  Tobacco Use  . Smoking status: Former Smoker    Types: Cigarettes    Quit date: 01/26/1975    Years since quitting: 45.0  . Smokeless tobacco: Never Used  Substance and Sexual Activity  . Alcohol use: No  . Drug use: No  . Sexual activity: Not Currently    Birth control/protection: None  Other Topics Concern  . Not on file  Social History Narrative  . Not on file   Social Determinants of Health   Financial Resource Strain:   . Difficulty of Paying Living Expenses:   Food Insecurity:   . Worried About Charity fundraiser in the Last Year:   . Arboriculturist in the Last Year:   Transportation Needs:   . Film/video editor (Medical):   Marland Kitchen Lack of Transportation (Non-Medical):   Physical Activity:   . Days of Exercise per Week:   . Minutes of Exercise per Session:   Stress:   . Feeling of Stress :   Social Connections:   . Frequency of Communication with Friends and Family:   . Frequency of Social Gatherings with Friends and Family:   . Attends Religious Services:   . Active Member of Clubs or Organizations:   . Attends Archivist Meetings:   Marland Kitchen Marital Status:   Intimate Partner Violence:   . Fear of Current or Ex-Partner:   . Emotionally Abused:   Marland Kitchen Physically Abused:   . Sexually Abused:     Objective:   Today's Vitals: BP 138/74   Pulse 69   Temp (!) 96.7 F (35.9 C) (Temporal)   Ht '5\' 6"'$  (1.676 m)   Wt 142 lb (64.4 kg)   SpO2 97%   BMI 22.92 kg/m   Physical Exam Vitals reviewed.  Constitutional:      General: She is not in acute distress.    Appearance: Normal appearance. She is normal weight. She is not ill-appearing, toxic-appearing or diaphoretic.  HENT:     Head: Normocephalic and atraumatic.  Eyes:     General: No scleral icterus.       Right eye: No discharge.        Left eye: No discharge.     Conjunctiva/sclera: Conjunctivae normal.  Cardiovascular:     Rate and Rhythm: Normal rate and  regular rhythm.     Heart sounds: Normal heart sounds. No murmur. No friction rub. No gallop.   Pulmonary:     Effort: Pulmonary effort is normal. No respiratory distress.     Breath sounds: Normal breath sounds. No stridor. No wheezing, rhonchi or rales.  Musculoskeletal:        General: Normal range of motion.     Cervical back: Normal range of motion.  Skin:    General: Skin is warm and dry.  Capillary Refill: Capillary refill takes less than 2 seconds.  Neurological:     General: No focal deficit present.     Mental Status: She is alert and oriented to person, place, and time. Mental status is at baseline.  Psychiatric:        Mood and Affect: Mood normal.        Behavior: Behavior normal.        Thought Content: Thought content normal.        Judgment: Judgment normal.

## 2020-01-29 ENCOUNTER — Encounter: Payer: Self-pay | Admitting: Family Medicine

## 2020-01-29 DIAGNOSIS — E785 Hyperlipidemia, unspecified: Secondary | ICD-10-CM | POA: Insufficient documentation

## 2020-01-29 DIAGNOSIS — N1831 Chronic kidney disease, stage 3a: Secondary | ICD-10-CM | POA: Insufficient documentation

## 2020-02-06 ENCOUNTER — Other Ambulatory Visit: Payer: Medicare Other

## 2020-02-06 ENCOUNTER — Other Ambulatory Visit: Payer: Self-pay

## 2020-02-06 DIAGNOSIS — N1831 Chronic kidney disease, stage 3a: Secondary | ICD-10-CM

## 2020-02-06 DIAGNOSIS — E785 Hyperlipidemia, unspecified: Secondary | ICD-10-CM

## 2020-02-07 ENCOUNTER — Encounter: Payer: Self-pay | Admitting: Family Medicine

## 2020-02-07 ENCOUNTER — Telehealth: Payer: Self-pay | Admitting: Family Medicine

## 2020-02-07 LAB — CMP14+EGFR
ALT: 13 IU/L (ref 0–32)
AST: 23 IU/L (ref 0–40)
Albumin/Globulin Ratio: 1.6 (ref 1.2–2.2)
Albumin: 4.1 g/dL (ref 3.6–4.6)
Alkaline Phosphatase: 57 IU/L (ref 48–121)
BUN/Creatinine Ratio: 16 (ref 12–28)
BUN: 17 mg/dL (ref 8–27)
Bilirubin Total: 0.9 mg/dL (ref 0.0–1.2)
CO2: 22 mmol/L (ref 20–29)
Calcium: 10.1 mg/dL (ref 8.7–10.3)
Chloride: 106 mmol/L (ref 96–106)
Creatinine, Ser: 1.06 mg/dL — ABNORMAL HIGH (ref 0.57–1.00)
GFR calc Af Amer: 55 mL/min/{1.73_m2} — ABNORMAL LOW (ref >59)
GFR calc non Af Amer: 47 mL/min/{1.73_m2} — ABNORMAL LOW (ref >59)
Globulin, Total: 2.5 g/dL (ref 1.5–4.5)
Glucose: 89 mg/dL (ref 65–99)
Potassium: 3.9 mmol/L (ref 3.5–5.2)
Sodium: 140 mmol/L (ref 134–144)
Total Protein: 6.6 g/dL (ref 6.0–8.5)

## 2020-02-07 LAB — CBC WITH DIFFERENTIAL/PLATELET
Basophils Absolute: 0 10*3/uL (ref 0.0–0.2)
Basos: 1 %
EOS (ABSOLUTE): 0 10*3/uL (ref 0.0–0.4)
Eos: 1 %
Hematocrit: 36.7 % (ref 34.0–46.6)
Hemoglobin: 12.5 g/dL (ref 11.1–15.9)
Immature Grans (Abs): 0 10*3/uL (ref 0.0–0.1)
Immature Granulocytes: 0 %
Lymphocytes Absolute: 1.2 10*3/uL (ref 0.7–3.1)
Lymphs: 27 %
MCH: 31 pg (ref 26.6–33.0)
MCHC: 34.1 g/dL (ref 31.5–35.7)
MCV: 91 fL (ref 79–97)
Monocytes Absolute: 0.4 10*3/uL (ref 0.1–0.9)
Monocytes: 10 %
Neutrophils Absolute: 2.8 10*3/uL (ref 1.4–7.0)
Neutrophils: 61 %
Platelets: 194 10*3/uL (ref 150–450)
RBC: 4.03 x10E6/uL (ref 3.77–5.28)
RDW: 12.6 % (ref 11.7–15.4)
WBC: 4.6 10*3/uL (ref 3.4–10.8)

## 2020-02-07 LAB — LIPID PANEL
Chol/HDL Ratio: 3.9 ratio (ref 0.0–4.4)
Cholesterol, Total: 213 mg/dL — ABNORMAL HIGH (ref 100–199)
HDL: 55 mg/dL (ref >39)
LDL Chol Calc (NIH): 138 mg/dL — ABNORMAL HIGH (ref 0–99)
Triglycerides: 114 mg/dL (ref 0–149)
VLDL Cholesterol Cal: 20 mg/dL (ref 5–40)

## 2020-02-07 NOTE — Telephone Encounter (Signed)
Patient is aware of lab results and instructions.

## 2020-02-07 NOTE — Telephone Encounter (Signed)
Pt returning call for lab results  

## 2021-03-28 ENCOUNTER — Other Ambulatory Visit: Payer: Self-pay

## 2021-03-28 ENCOUNTER — Encounter: Payer: Self-pay | Admitting: Family Medicine

## 2021-03-28 ENCOUNTER — Ambulatory Visit (INDEPENDENT_AMBULATORY_CARE_PROVIDER_SITE_OTHER): Payer: Medicare Other | Admitting: Family Medicine

## 2021-03-28 ENCOUNTER — Other Ambulatory Visit: Payer: Self-pay | Admitting: Family Medicine

## 2021-03-28 VITALS — BP 160/82 | HR 73 | Temp 97.4°F | Ht 66.0 in | Wt 137.4 lb

## 2021-03-28 DIAGNOSIS — N904 Leukoplakia of vulva: Secondary | ICD-10-CM

## 2021-03-28 DIAGNOSIS — R030 Elevated blood-pressure reading, without diagnosis of hypertension: Secondary | ICD-10-CM | POA: Insufficient documentation

## 2021-03-28 DIAGNOSIS — E785 Hyperlipidemia, unspecified: Secondary | ICD-10-CM

## 2021-03-28 DIAGNOSIS — N1831 Chronic kidney disease, stage 3a: Secondary | ICD-10-CM | POA: Diagnosis not present

## 2021-03-28 MED ORDER — CLOBETASOL PROPIONATE 0.05 % EX LOTN: 1.0000 "application " | TOPICAL_LOTION | Freq: Every day | CUTANEOUS | 0 refills | Status: DC

## 2021-03-28 NOTE — Progress Notes (Signed)
Assessment & Plan:  1. Lichen sclerosus of vulva - Clobetasol Propionate 0.05 % lotion; Apply 1 application topically daily. x6 weeks  Dispense: 59 mL; Refill: 0  2. Stage 3a chronic kidney disease ([place]) Labs today to assess.  Discussed avoidance of all NSAIDs.  Encouraged water.  Education provided on CKD. - CMP14+EGFR  3. Dyslipidemia Labs to assess.  Education provided on hyperlipidemia. - Lipid panel  4. Elevated BP without diagnosis of hypertension Blood pressure is elevated today and was also elevated at urgent care in April and June.  Patient advised to purchase a blood pressure cuff to monitor at home, keep a log, and bring it back with her to her next appointment.  Discussed if her blood pressures are this high at home as well, she needs medication.   Return in about 3 months (around 06/28/2021) for follow-up of chronic medication conditions.  Hendricks Limes, MSN, APRN, FNP-C [facility]  Subjective:    Patient ID: Loretta Miles, female    DOB: Jan 19, 1931, 87 y.o.   MRN: [id number]  Patient Care Team: Loman Brooklyn, FNP as PCP - General (Family Medicine)   Chief Complaint:  Chief Complaint  Patient presents with   Personal Problem    Patient states she feels she is raw in her private area     HPI: Loretta Miles is a 87 y.o. female presenting on 03/28/2021 for Personal Problem (Patient states she feels she is raw in her private area )  Patient reports a burning pain in her perineal area x3 days.  Denies any burning with urination except when the urine hits that spot.  New complaints: None  Social history:  Relevant past medical, surgical, family and social history reviewed and updated as indicated. Interim medical history since our last visit reviewed.  Allergies and medications reviewed and updated.  DATA REVIEWED: CHART IN EPIC  ROS: Negative unless specifically indicated above in HPI.    Current Outpatient Medications:     Clobetasol Propionate 0.05 % lotion, Apply 1 application topically daily. x6 weeks, Disp: 59 mL, Rfl: 0   Allergies  Allergen Reactions   Albuterol Other (See Comments)    Experienced severe chest tightness and difficulty breathing.  Experienced severe chest tightness and difficulty breathing.     Coconut Oil Other (See Comments)    CAUSES ACID REFLUX AND VISION PROBLEMS CAUSES ACID REFLUX AND VISION PROBLEMS    Cortisone Other (See Comments)    High dose   Albuterol Sulfate    Antihistamines, Chlorpheniramine-Type Other (See Comments)    BP went up   Chocolate    Cocoa    Coconut Flavor    Mint Chocolate Chip Flavor    Prednisone Other (See Comments)    Caused "me to be crazy"   Tamiflu [Oseltamivir] Other (See Comments)    Caused heart to be irreg, bp to go up   Tetanus Toxoids    Hydrocodone Nausea Only   Past Medical History:  Diagnosis Date   Arthritis    rt hip, lt knee   Cancer ([place])    skin   CKD (chronic kidney disease)    Dysrhythmia 2017   episode of RAF   GERD (gastroesophageal reflux disease)    Headache    rare migraines   Kidney disease    Stenosing tenosynovitis of finger of right hand    ring finger    Past Surgical History:  Procedure Laterality Date   JOINT REPLACEMENT  hip replacement   MOUTH SURGERY     TONSILLECTOMY     TOTAL HIP ARTHROPLASTY Right    TRIGGER FINGER RELEASE Right 07/15/2016   Procedure: RELEASE TRIGGER FINGER/A-1 PULLEY RIGHT RING FINGER;  Surgeon: Daryll Brod, MD;  Location: [place];  Service: Orthopedics;  Laterality: Right;    Social History   Socioeconomic History   Marital status: Divorced    Spouse name: Not on file   Number of children: Not on file   Years of education: Not on file   Highest education level: Not on file  Occupational History   Not on file  Tobacco Use   Smoking status: Former    Types: Cigarettes    Quit date: 01/26/1975    Years since quitting: 46.2   Smokeless  tobacco: Never  Vaping Use   Vaping Use: Never used  Substance and Sexual Activity   Alcohol use: No   Drug use: No   Sexual activity: Not Currently    Birth control/protection: None  Other Topics Concern   Not on file  Social History Narrative   Not on file   Social Determinants of Health   Financial Resource Strain: Not on file  Food Insecurity: Not on file  Transportation Needs: Not on file  Physical Activity: Not on file  Stress: Not on file  Social Connections: Not on file  Intimate Partner Violence: Not on file        Objective:    BP (!) 160/82   Pulse 73   Temp (!) 97.4 F (36.3 C) (Temporal)   Ht '5\' 6"'  (1.676 m)   Wt 137 lb 6.4 oz (62.3 kg)   BMI 22.18 kg/m   Wt Readings from Last 3 Encounters:  03/28/21 137 lb 6.4 oz (62.3 kg)  01/26/20 142 lb (64.4 kg)  02/12/19 140 lb (63.5 kg)    Physical Exam Vitals reviewed. Exam conducted with a chaperone present.  Constitutional:      General: She is not in acute distress.    Appearance: Normal appearance. She is normal weight. She is not ill-appearing, toxic-appearing or diaphoretic.  HENT:     Head: Normocephalic and atraumatic.  Eyes:     General: No scleral icterus.       Right eye: No discharge.        Left eye: No discharge.     Conjunctiva/sclera: Conjunctivae normal.  Cardiovascular:     Rate and Rhythm: Normal rate.  Pulmonary:     Effort: Pulmonary effort is normal. No respiratory distress.  Genitourinary:    Pubic Area: No rash or pubic lice.      Labia:        Right: Tenderness and lesion (Very small cut at the bottom of her right labia) present. No rash or injury.        Left: No rash, tenderness, lesion or injury.      Comments: Bilateral labia have white patchy areas consistent with lichen sclerosus.  Musculoskeletal:        General: Normal range of motion.     Cervical back: Normal range of motion.  Skin:    General: Skin is warm and dry.     Capillary Refill: Capillary refill takes  less than 2 seconds.  Neurological:     General: No focal deficit present.     Mental Status: She is alert and oriented to person, place, and time. Mental status is at baseline.  Psychiatric:  Mood and Affect: Mood normal.        Behavior: Behavior normal.        Thought Content: Thought content normal.        Judgment: Judgment normal.    No results found for: TSH Lab Results  Component Value Date   WBC 4.6 02/06/2020   HGB 12.5 02/06/2020   HCT 36.7 02/06/2020   MCV 91 02/06/2020   PLT 194 02/06/2020   Lab Results  Component Value Date   NA 140 02/06/2020   K 3.9 02/06/2020   CO2 22 02/06/2020   GLUCOSE 89 02/06/2020   BUN 17 02/06/2020   CREATININE 1.06 (H) 02/06/2020   BILITOT 0.9 02/06/2020   ALKPHOS 57 02/06/2020   AST 23 02/06/2020   ALT 13 02/06/2020   PROT 6.6 02/06/2020   ALBUMIN 4.1 02/06/2020   CALCIUM 10.1 02/06/2020   Lab Results  Component Value Date   CHOL 213 (H) 02/06/2020   Lab Results  Component Value Date   HDL 55 02/06/2020   Lab Results  Component Value Date   LDLCALC 138 (H) 02/06/2020   Lab Results  Component Value Date   TRIG 114 02/06/2020   Lab Results  Component Value Date   CHOLHDL 3.9 02/06/2020   No results found for: HGBA1C

## 2021-03-28 NOTE — Telephone Encounter (Signed)
Cream instead of lotion sent in.

## 2021-03-29 LAB — LIPID PANEL
Chol/HDL Ratio: 3.6 ratio (ref 0.0–4.4)
Cholesterol, Total: 237 mg/dL — ABNORMAL HIGH (ref 100–199)
HDL: 66 mg/dL (ref >39)
LDL Chol Calc (NIH): 148 mg/dL — ABNORMAL HIGH (ref 0–99)
Triglycerides: 131 mg/dL (ref 0–149)
VLDL Cholesterol Cal: 23 mg/dL (ref 5–40)

## 2021-03-29 LAB — CMP14+EGFR
ALT: 11 IU/L (ref 0–32)
AST: 22 IU/L (ref 0–40)
Albumin/Globulin Ratio: 1.5 (ref 1.2–2.2)
Albumin: 4.1 g/dL (ref 3.6–4.6)
Alkaline Phosphatase: 68 IU/L (ref 44–121)
BUN/Creatinine Ratio: 30 — ABNORMAL HIGH (ref 12–28)
BUN: 27 mg/dL (ref 8–27)
Bilirubin Total: 0.6 mg/dL (ref 0.0–1.2)
CO2: 22 mmol/L (ref 20–29)
Calcium: 10.3 mg/dL (ref 8.7–10.3)
Chloride: 109 mmol/L — ABNORMAL HIGH (ref 96–106)
Creatinine, Ser: 0.91 mg/dL (ref 0.57–1.00)
Globulin, Total: 2.8 g/dL (ref 1.5–4.5)
Glucose: 84 mg/dL (ref 65–99)
Potassium: 4.8 mmol/L (ref 3.5–5.2)
Sodium: 143 mmol/L (ref 134–144)
Total Protein: 6.9 g/dL (ref 6.0–8.5)
eGFR: 61 mL/min/{1.73_m2} (ref >59)

## 2021-05-30 DIAGNOSIS — C44319 Basal cell carcinoma of skin of other parts of face: Secondary | ICD-10-CM | POA: Diagnosis not present

## 2021-05-30 DIAGNOSIS — C441191 Basal cell carcinoma of skin of left upper eyelid, including canthus: Secondary | ICD-10-CM | POA: Diagnosis not present

## 2021-05-30 DIAGNOSIS — D0439 Carcinoma in situ of skin of other parts of face: Secondary | ICD-10-CM | POA: Diagnosis not present

## 2021-05-30 DIAGNOSIS — L821 Other seborrheic keratosis: Secondary | ICD-10-CM | POA: Diagnosis not present

## 2021-05-30 DIAGNOSIS — Z1283 Encounter for screening for malignant neoplasm of skin: Secondary | ICD-10-CM | POA: Diagnosis not present

## 2021-05-30 DIAGNOSIS — D225 Melanocytic nevi of trunk: Secondary | ICD-10-CM | POA: Diagnosis not present

## 2021-06-27 ENCOUNTER — Ambulatory Visit: Payer: Medicare Other | Admitting: Family Medicine

## 2021-07-02 ENCOUNTER — Encounter: Payer: Self-pay | Admitting: Family Medicine

## 2021-07-02 ENCOUNTER — Ambulatory Visit (INDEPENDENT_AMBULATORY_CARE_PROVIDER_SITE_OTHER): Payer: Medicare Other | Admitting: Family Medicine

## 2021-07-02 ENCOUNTER — Other Ambulatory Visit: Payer: Self-pay

## 2021-07-02 VITALS — BP 191/83 | HR 64 | Temp 96.5°F | Ht 66.0 in | Wt 140.4 lb

## 2021-07-02 DIAGNOSIS — I10 Essential (primary) hypertension: Secondary | ICD-10-CM

## 2021-07-02 MED ORDER — LISINOPRIL 10 MG PO TABS: 10.0000 mg | ORAL_TABLET | Freq: Every day | ORAL | 2 refills | Status: DC

## 2021-07-02 NOTE — Progress Notes (Signed)
Assessment & Plan:  1. Essential hypertension Uncontrolled. Started Lisinopril 10 mg QD. Patient to continue monitoring her BP at home, keep a log, and bring it back with her to her next appointment. Education provided on the DASH diet. - lisinopril (ZESTRIL) 10 MG tablet; Take 1 tablet (10 mg total) by mouth daily.  Dispense: 30 tablet; Refill: 2   Return in about 6 weeks (around 08/13/2021) for HTN w. labs.  Hendricks Limes, MSN, APRN, FNP-C [facility]  Subjective:    Patient ID: Loretta Miles, female    DOB: [birth year], 87 y.o.   MRN: [id number]  Patient Care Team: Loman Brooklyn, FNP as PCP - General (Family Medicine)   Chief Complaint:  Chief Complaint  Patient presents with   Medical Management of Chronic Issues    HPI: Loretta Miles is a 87 y.o. female presenting on 07/02/2021 for Medical Management of Chronic Issues  Hypertension Patient has been monitoring her blood pressure at home. Systolic ranges 176-160 with 28/37 >140. Diastolic ranges 73-71. Heart rate ranges 47-91 with 1/37 in the 40s, 12/37 in the 50s. 18/37 in the 60s, 5/37 in the 70s, and 1/37 in the 90s.   New complaints: None   Social history:  Relevant past medical, surgical, family and social history reviewed and updated as indicated. Interim medical history since our last visit reviewed.  Allergies and medications reviewed and updated.  DATA REVIEWED: CHART IN EPIC  ROS: Negative unless specifically indicated above in HPI.   No current outpatient medications on file.   Allergies  Allergen Reactions   Albuterol Other (See Comments)    Experienced severe chest tightness and difficulty breathing.  Experienced severe chest tightness and difficulty breathing.     Coconut Oil Other (See Comments)    CAUSES ACID REFLUX AND VISION PROBLEMS CAUSES ACID REFLUX AND VISION PROBLEMS    Cortisone Other (See Comments)    High dose   Albuterol Sulfate    Antihistamines,  Chlorpheniramine-Type Other (See Comments)    BP went up Elevates blood pressure   Chocolate    Cocoa    Coconut Flavor    Mint Chocolate Chip Flavor    Prednisone Other (See Comments)    Caused "me to be crazy"   Tamiflu [Oseltamivir] Other (See Comments)    Caused heart to be irreg, bp to go up   Tetanus Toxoids    Hydrocodone Nausea Only   Past Medical History:  Diagnosis Date   Arthritis    rt hip, lt knee   Cancer ([place])    skin   CKD (chronic kidney disease)    Dysrhythmia 2017   episode of RAF   GERD (gastroesophageal reflux disease)    Headache    rare migraines   Stenosing tenosynovitis of finger of right hand    ring finger    Past Surgical History:  Procedure Laterality Date   JOINT REPLACEMENT     hip replacement   MOUTH SURGERY     TONSILLECTOMY     TOTAL HIP ARTHROPLASTY Right    TRIGGER FINGER RELEASE Right 07/15/2016   Procedure: RELEASE TRIGGER FINGER/A-1 PULLEY RIGHT RING FINGER;  Surgeon: Daryll Brod, MD;  Location: [facility];  Service: Orthopedics;  Laterality: Right;    Social History   Socioeconomic History   Marital status: Divorced    Spouse name: Not on file   Number of children: Not on file   Years of education: Not on file  Highest education level: Not on file  Occupational History   Not on file  Tobacco Use   Smoking status: Former    Types: Cigarettes    Quit date: 01/26/1975    Years since quitting: 46.4   Smokeless tobacco: Never  Vaping Use   Vaping Use: Never used  Substance and Sexual Activity   Alcohol use: No   Drug use: No   Sexual activity: Not Currently    Birth control/protection: None  Other Topics Concern   Not on file  Social History Narrative   Not on file   Social Determinants of Health   Financial Resource Strain: Not on file  Food Insecurity: Not on file  Transportation Needs: Not on file  Physical Activity: Not on file  Stress: Not on file  Social Connections: Not on file  Intimate  Partner Violence: Not on file        Objective:    BP (!) 191/83   Pulse 64   Temp (!) 96.5 F (35.8 C) (Temporal)   Ht '5\' 6"'  (1.676 m)   Wt 140 lb 6.4 oz (63.7 kg)   SpO2 99%   BMI 22.66 kg/m   Wt Readings from Last 3 Encounters:  07/02/21 140 lb 6.4 oz (63.7 kg)  03/28/21 137 lb 6.4 oz (62.3 kg)  01/26/20 142 lb (64.4 kg)    Physical Exam Vitals reviewed.  Constitutional:      General: She is not in acute distress.    Appearance: Normal appearance. She is normal weight. She is not ill-appearing, toxic-appearing or diaphoretic.  HENT:     Head: Normocephalic and atraumatic.  Eyes:     General: No scleral icterus.       Right eye: No discharge.        Left eye: No discharge.     Conjunctiva/sclera: Conjunctivae normal.  Cardiovascular:     Rate and Rhythm: Normal rate and regular rhythm.     Heart sounds: Normal heart sounds. No murmur heard.   No friction rub. No gallop.  Pulmonary:     Effort: Pulmonary effort is normal. No respiratory distress.     Breath sounds: Normal breath sounds. No stridor. No wheezing, rhonchi or rales.  Musculoskeletal:        General: Normal range of motion.     Cervical back: Normal range of motion.  Skin:    General: Skin is warm and dry.     Capillary Refill: Capillary refill takes less than 2 seconds.  Neurological:     General: No focal deficit present.     Mental Status: She is alert and oriented to person, place, and time. Mental status is at baseline.  Psychiatric:        Mood and Affect: Mood normal.        Behavior: Behavior normal.        Thought Content: Thought content normal.        Judgment: Judgment normal.    No results found for: TSH Lab Results  Component Value Date   WBC 4.6 02/06/2020   HGB 12.5 02/06/2020   HCT 36.7 02/06/2020   MCV 91 02/06/2020   PLT 194 02/06/2020   Lab Results  Component Value Date   NA 143 03/28/2021   K 4.8 03/28/2021   CO2 22 03/28/2021   GLUCOSE 84 03/28/2021   BUN 27  03/28/2021   CREATININE 0.91 03/28/2021   BILITOT 0.6 03/28/2021   ALKPHOS 68 03/28/2021   AST 22 03/28/2021  ALT 11 03/28/2021   PROT 6.9 03/28/2021   ALBUMIN 4.1 03/28/2021   CALCIUM 10.3 03/28/2021   EGFR 61 03/28/2021   Lab Results  Component Value Date   CHOL 237 (H) 03/28/2021   Lab Results  Component Value Date   HDL 66 03/28/2021   Lab Results  Component Value Date   LDLCALC 148 (H) 03/28/2021   Lab Results  Component Value Date   TRIG 131 03/28/2021   Lab Results  Component Value Date   CHOLHDL 3.6 03/28/2021   No results found for: HGBA1C

## 2021-07-04 DIAGNOSIS — Z85828 Personal history of other malignant neoplasm of skin: Secondary | ICD-10-CM | POA: Diagnosis not present

## 2021-07-04 DIAGNOSIS — L821 Other seborrheic keratosis: Secondary | ICD-10-CM | POA: Diagnosis not present

## 2021-07-04 DIAGNOSIS — Z08 Encounter for follow-up examination after completed treatment for malignant neoplasm: Secondary | ICD-10-CM | POA: Diagnosis not present

## 2021-07-07 ENCOUNTER — Encounter: Payer: Self-pay | Admitting: Family Medicine

## 2021-07-07 DIAGNOSIS — I10 Essential (primary) hypertension: Secondary | ICD-10-CM | POA: Insufficient documentation

## 2021-08-14 ENCOUNTER — Ambulatory Visit (INDEPENDENT_AMBULATORY_CARE_PROVIDER_SITE_OTHER): Payer: Medicare Other | Admitting: Family Medicine

## 2021-08-14 ENCOUNTER — Encounter: Payer: Self-pay | Admitting: Family Medicine

## 2021-08-14 VITALS — BP 137/69 | HR 81 | Temp 97.9°F | Ht 66.0 in | Wt 139.8 lb

## 2021-08-14 DIAGNOSIS — I10 Essential (primary) hypertension: Secondary | ICD-10-CM | POA: Diagnosis not present

## 2021-08-14 LAB — CBC WITH DIFFERENTIAL/PLATELET
Basophils Absolute: 0 10*3/uL (ref 0.0–0.2)
Basos: 1 %
EOS (ABSOLUTE): 0.1 10*3/uL (ref 0.0–0.4)
Eos: 2 %
Hematocrit: 38.2 % (ref 34.0–46.6)
Hemoglobin: 13 g/dL (ref 11.1–15.9)
Immature Grans (Abs): 0 10*3/uL (ref 0.0–0.1)
Immature Granulocytes: 0 %
Lymphocytes Absolute: 1.5 10*3/uL (ref 0.7–3.1)
Lymphs: 32 %
MCH: 31 pg (ref 26.6–33.0)
MCHC: 34 g/dL (ref 31.5–35.7)
MCV: 91 fL (ref 79–97)
Monocytes Absolute: 0.5 10*3/uL (ref 0.1–0.9)
Monocytes: 12 %
Neutrophils Absolute: 2.5 10*3/uL (ref 1.4–7.0)
Neutrophils: 53 %
Platelets: 222 10*3/uL (ref 150–450)
RBC: 4.2 x10E6/uL (ref 3.77–5.28)
RDW: 12.4 % (ref 11.7–15.4)
WBC: 4.7 10*3/uL (ref 3.4–10.8)

## 2021-08-14 LAB — CMP14+EGFR
ALT: 14 IU/L (ref 0–32)
AST: 21 IU/L (ref 0–40)
Albumin/Globulin Ratio: 1.8 (ref 1.2–2.2)
Albumin: 4.4 g/dL (ref 3.6–4.6)
Alkaline Phosphatase: 59 IU/L (ref 44–121)
BUN/Creatinine Ratio: 22 (ref 12–28)
BUN: 23 mg/dL (ref 8–27)
Bilirubin Total: 0.7 mg/dL (ref 0.0–1.2)
CO2: 27 mmol/L (ref 20–29)
Calcium: 10.8 mg/dL — ABNORMAL HIGH (ref 8.7–10.3)
Chloride: 103 mmol/L (ref 96–106)
Creatinine, Ser: 1.04 mg/dL — ABNORMAL HIGH (ref 0.57–1.00)
Globulin, Total: 2.4 g/dL (ref 1.5–4.5)
Glucose: 98 mg/dL (ref 70–99)
Potassium: 5.6 mmol/L — ABNORMAL HIGH (ref 3.5–5.2)
Sodium: 141 mmol/L (ref 134–144)
Total Protein: 6.8 g/dL (ref 6.0–8.5)
eGFR: 52 mL/min/{1.73_m2} — ABNORMAL LOW (ref >59)

## 2021-08-14 LAB — LIPID PANEL
Chol/HDL Ratio: 4 ratio (ref 0.0–4.4)
Cholesterol, Total: 237 mg/dL — ABNORMAL HIGH (ref 100–199)
HDL: 59 mg/dL (ref >39)
LDL Chol Calc (NIH): 162 mg/dL — ABNORMAL HIGH (ref 0–99)
Triglycerides: 90 mg/dL (ref 0–149)
VLDL Cholesterol Cal: 16 mg/dL (ref 5–40)

## 2021-08-14 NOTE — Patient Instructions (Signed)
[facility] in [place]

## 2021-08-14 NOTE — Progress Notes (Signed)
Assessment & Plan:  1. Essential hypertension Uncontrolled. Patient is going to start the Lisinopril since her blood pressure is not controlled with supplements alone. Education provided on the DASH diet. - CMP14+EGFR - Lipid panel - CBC with Differential/Platelet   Return in about 2 months (around 10/14/2021) for HTN.  Hendricks Limes, MSN, APRN, FNP-C [facility]  Subjective:    Patient ID: Loretta Miles, female    DOB: [birth year], 87 y.o.   MRN: [id number]  Patient Care Team: Loman Brooklyn, FNP as PCP - General (Family Medicine)   Chief Complaint:  Chief Complaint  Patient presents with   Hypertension    6 week follow up     HPI: Loretta Miles is a 87 y.o. female presenting on 08/14/2021 for Hypertension (6 week follow up )  Hypertension Patient has been monitoring her blood pressure at home. Systolic ranges 833-825 with 14/22 >140. Diastolic ranges 05-39. Heart rate ranges 53-96. She has been taking multiple new supplements so try to improve her health without prescription medication. She was prescribed Lisinopril 10 mg at our last visit, but never started it.  New complaints: None   Social history:  Relevant past medical, surgical, family and social history reviewed and updated as indicated. Interim medical history since our last visit reviewed.  Allergies and medications reviewed and updated.  DATA REVIEWED: CHART IN EPIC  ROS: Negative unless specifically indicated above in HPI.    Current Outpatient Medications:    lisinopril (ZESTRIL) 10 MG tablet, Take 1 tablet (10 mg total) by mouth daily. (Patient not taking: Reported on 08/14/2021), Disp: 30 tablet, Rfl: 2   Allergies  Allergen Reactions   Albuterol Other (See Comments)    Experienced severe chest tightness and difficulty breathing.  Experienced severe chest tightness and difficulty breathing.     Coconut Oil Other (See Comments)    CAUSES ACID REFLUX AND VISION  PROBLEMS CAUSES ACID REFLUX AND VISION PROBLEMS    Cortisone Other (See Comments)    High dose   Albuterol Sulfate    Antihistamines, Chlorpheniramine-Type Other (See Comments)    BP went up Elevates blood pressure   Chocolate    Cocoa    Coconut Flavor    Mint Chocolate Chip Flavor    Prednisone Other (See Comments)    Caused "me to be crazy"   Tamiflu [Oseltamivir] Other (See Comments)    Caused heart to be irreg, bp to go up   Tetanus Toxoids    Hydrocodone Nausea Only   Past Medical History:  Diagnosis Date   Arthritis    rt hip, lt knee   Cancer ([place])    skin   CKD (chronic kidney disease)    Dysrhythmia 2017   episode of RAF   GERD (gastroesophageal reflux disease)    Headache    rare migraines   Stenosing tenosynovitis of finger of right hand    ring finger    Past Surgical History:  Procedure Laterality Date   JOINT REPLACEMENT     hip replacement   MOUTH SURGERY     TONSILLECTOMY     TOTAL HIP ARTHROPLASTY Right    TRIGGER FINGER RELEASE Right 07/15/2016   Procedure: RELEASE TRIGGER FINGER/A-1 PULLEY RIGHT RING FINGER;  Surgeon: Daryll Brod, MD;  Location: [facility];  Service: Orthopedics;  Laterality: Right;    Social History   Socioeconomic History   Marital status: Divorced    Spouse name: Not on file  Number of children: Not on file   Years of education: Not on file   Highest education level: Not on file  Occupational History   Not on file  Tobacco Use   Smoking status: Former    Types: Cigarettes    Quit date: 01/26/1975    Years since quitting: 46.5   Smokeless tobacco: Never  Vaping Use   Vaping Use: Never used  Substance and Sexual Activity   Alcohol use: No   Drug use: No   Sexual activity: Not Currently    Birth control/protection: None  Other Topics Concern   Not on file  Social History Narrative   Not on file   Social Determinants of Health   Financial Resource Strain: Not on file  Food Insecurity: Not on  file  Transportation Needs: Not on file  Physical Activity: Not on file  Stress: Not on file  Social Connections: Not on file  Intimate Partner Violence: Not on file        Objective:    BP 137/69   Pulse 81   Temp 97.9 F (36.6 C) (Temporal)   Ht 5' 6" (1.676 m)   Wt 139 lb 12.8 oz (63.4 kg)   SpO2 98%   BMI 22.56 kg/m   Wt Readings from Last 3 Encounters:  08/14/21 139 lb 12.8 oz (63.4 kg)  07/02/21 140 lb 6.4 oz (63.7 kg)  03/28/21 137 lb 6.4 oz (62.3 kg)    Physical Exam Vitals reviewed.  Constitutional:      General: She is not in acute distress.    Appearance: Normal appearance. She is normal weight. She is not ill-appearing, toxic-appearing or diaphoretic.  HENT:     Head: Normocephalic and atraumatic.  Eyes:     General: No scleral icterus.       Right eye: No discharge.        Left eye: No discharge.     Conjunctiva/sclera: Conjunctivae normal.  Cardiovascular:     Rate and Rhythm: Normal rate and regular rhythm.     Heart sounds: Normal heart sounds. No murmur heard.   No friction rub. No gallop.  Pulmonary:     Effort: Pulmonary effort is normal. No respiratory distress.     Breath sounds: Normal breath sounds. No stridor. No wheezing, rhonchi or rales.  Musculoskeletal:        General: Normal range of motion.     Cervical back: Normal range of motion.  Skin:    General: Skin is warm and dry.     Capillary Refill: Capillary refill takes less than 2 seconds.  Neurological:     General: No focal deficit present.     Mental Status: She is alert and oriented to person, place, and time. Mental status is at baseline.  Psychiatric:        Mood and Affect: Mood normal.        Behavior: Behavior normal.        Thought Content: Thought content normal.        Judgment: Judgment normal.    No results found for: TSH Lab Results  Component Value Date   WBC 4.6 02/06/2020   HGB 12.5 02/06/2020   HCT 36.7 02/06/2020   MCV 91 02/06/2020   PLT 194  02/06/2020   Lab Results  Component Value Date   NA 143 03/28/2021   K 4.8 03/28/2021   CO2 22 03/28/2021   GLUCOSE 84 03/28/2021   BUN 27 03/28/2021   CREATININE 0.91   03/28/2021   BILITOT 0.6 03/28/2021   ALKPHOS 68 03/28/2021   AST 22 03/28/2021   ALT 11 03/28/2021   PROT 6.9 03/28/2021   ALBUMIN 4.1 03/28/2021   CALCIUM 10.3 03/28/2021   EGFR 61 03/28/2021   Lab Results  Component Value Date   CHOL 237 (H) 03/28/2021   Lab Results  Component Value Date   HDL 66 03/28/2021   Lab Results  Component Value Date   LDLCALC 148 (H) 03/28/2021   Lab Results  Component Value Date   TRIG 131 03/28/2021   Lab Results  Component Value Date   CHOLHDL 3.6 03/28/2021   No results found for: HGBA1C          

## 2021-08-15 ENCOUNTER — Other Ambulatory Visit: Payer: Self-pay | Admitting: Family Medicine

## 2021-08-15 DIAGNOSIS — E875 Hyperkalemia: Secondary | ICD-10-CM

## 2021-08-19 ENCOUNTER — Encounter: Payer: Self-pay | Admitting: Family Medicine

## 2021-08-28 ENCOUNTER — Other Ambulatory Visit: Payer: Medicare Other

## 2021-08-28 DIAGNOSIS — E875 Hyperkalemia: Secondary | ICD-10-CM | POA: Diagnosis not present

## 2021-08-28 LAB — BMP8+EGFR
BUN/Creatinine Ratio: 17 (ref 12–28)
BUN: 16 mg/dL (ref 8–27)
CO2: 23 mmol/L (ref 20–29)
Calcium: 10.1 mg/dL (ref 8.7–10.3)
Chloride: 105 mmol/L (ref 96–106)
Creatinine, Ser: 0.95 mg/dL (ref 0.57–1.00)
Glucose: 61 mg/dL — ABNORMAL LOW (ref 70–99)
Potassium: 4.8 mmol/L (ref 3.5–5.2)
Sodium: 140 mmol/L (ref 134–144)
eGFR: 58 mL/min/{1.73_m2} — ABNORMAL LOW (ref >59)

## 2021-10-08 ENCOUNTER — Ambulatory Visit (INDEPENDENT_AMBULATORY_CARE_PROVIDER_SITE_OTHER): Payer: Medicare Other

## 2021-10-08 VITALS — Ht 66.0 in | Wt 139.0 lb

## 2021-10-08 DIAGNOSIS — Z0000 Encounter for general adult medical examination without abnormal findings: Secondary | ICD-10-CM | POA: Diagnosis not present

## 2021-10-08 NOTE — Patient Instructions (Signed)
Loretta Miles , Thank you for taking time to come for your Medicare Wellness Visit. I appreciate your ongoing commitment to your health goals. Please review the following plan we discussed and let me know if I can assist you in the future.   Screening recommendations/referrals: Colonoscopy: no longer required Mammogram: no longer required Bone Density: no longer required Recommended yearly ophthalmology/optometry visit for glaucoma screening and checkup Recommended yearly dental visit for hygiene and checkup  Vaccinations: Influenza vaccine: Due Pneumococcal vaccine: Due Tdap vaccine: Due Shingles vaccine: Due   Covid-19: Done 10/19/2019, 11/17/2019, and one booster - we need date please  Advanced directives: Please bring a copy of your health care power of attorney and living will to the office to be added to your chart at your convenience.   Conditions/risks identified: Aim for 30 minutes of exercise or brisk walking each day, drink 6-8 glasses of water and eat lots of fruits and vegetables.  Next appointment: Follow up in one year for your annual wellness visit    Preventive Care 65 Years and Older, Female Preventive care refers to lifestyle choices and visits with your health care provider that can promote health and wellness. What does preventive care include? A yearly physical exam. This is also called an annual well check. Dental exams once or twice a year. Routine eye exams. Ask your health care provider how often you should have your eyes checked. Personal lifestyle choices, including: Daily care of your teeth and gums. Regular physical activity. Eating a healthy diet. Avoiding tobacco and drug use. Limiting alcohol use. Practicing safe sex. Taking low-dose aspirin every day. Taking vitamin and mineral supplements as recommended by your health care provider. What happens during an annual well check? The services and screenings done by your health care provider during your  annual well check will depend on your age, overall health, lifestyle risk factors, and family history of disease. Counseling  Your health care provider may ask you questions about your: Alcohol use. Tobacco use. Drug use. Emotional well-being. Home and relationship well-being. Sexual activity. Eating habits. History of falls. Memory and ability to understand (cognition). Work and work Statistician. Reproductive health. Screening  You may have the following tests or measurements: Height, weight, and BMI. Blood pressure. Lipid and cholesterol levels. These may be checked every 5 years, or more frequently if you are over 80 years old. Skin check. Lung cancer screening. You may have this screening every year starting at age 61 if you have a 30-pack-year history of smoking and currently smoke or have quit within the past 15 years. Fecal occult blood test (FOBT) of the stool. You may have this test every year starting at age 79. Flexible sigmoidoscopy or colonoscopy. You may have a sigmoidoscopy every 5 years or a colonoscopy every 10 years starting at age 6. Hepatitis C blood test. Hepatitis B blood test. Sexually transmitted disease (STD) testing. Diabetes screening. This is done by checking your blood sugar (glucose) after you have not eaten for a while (fasting). You may have this done every 1-3 years. Bone density scan. This is done to screen for osteoporosis. You may have this done starting at age 52. Mammogram. This may be done every 1-2 years. Talk to your health care provider about how often you should have regular mammograms. Talk with your health care provider about your test results, treatment options, and if necessary, the need for more tests. Vaccines  Your health care provider may recommend certain vaccines, such as: Influenza vaccine. This  is recommended every year. Tetanus, diphtheria, and acellular pertussis (Tdap, Td) vaccine. You may need a Td booster every 10  years. Zoster vaccine. You may need this after age 35. Pneumococcal 13-valent conjugate (PCV13) vaccine. One dose is recommended after age 46. Pneumococcal polysaccharide (PPSV23) vaccine. One dose is recommended after age 20. Talk to your health care provider about which screenings and vaccines you need and how often you need them. This information is not intended to replace advice given to you by your health care provider. Make sure you discuss any questions you have with your health care provider. Document Released: 09/28/2015 Document Revised: 05/21/2016 Document Reviewed: 07/03/2015 Elsevier Interactive Patient Education  2017 [employer] Prevention in the Home Falls can cause injuries. They can happen to people of all ages. There are many things you can do to make your home safe and to help prevent falls. What can I do on the outside of my home? Regularly fix the edges of walkways and driveways and fix any cracks. Remove anything that might make you trip as you walk through a door, such as a raised step or threshold. Trim any bushes or trees on the path to your home. Use bright outdoor lighting. Clear any walking paths of anything that might make someone trip, such as rocks or tools. Regularly check to see if handrails are loose or broken. Make sure that both sides of any steps have handrails. Any raised decks and porches should have guardrails on the edges. Have any leaves, snow, or ice cleared regularly. Use sand or salt on walking paths during winter. Clean up any spills in your garage right away. This includes oil or grease spills. What can I do in the bathroom? Use night lights. Install grab bars by the toilet and in the tub and shower. Do not use towel bars as grab bars. Use non-skid mats or decals in the tub or shower. If you need to sit down in the shower, use a plastic, non-slip stool. Keep the floor dry. Clean up any water that spills on the floor as soon as it  happens. Remove soap buildup in the tub or shower regularly. Attach bath mats securely with double-sided non-slip rug tape. Do not have throw rugs and other things on the floor that can make you trip. What can I do in the bedroom? Use night lights. Make sure that you have a light by your bed that is easy to reach. Do not use any sheets or blankets that are too big for your bed. They should not hang down onto the floor. Have a firm chair that has side arms. You can use this for support while you get dressed. Do not have throw rugs and other things on the floor that can make you trip. What can I do in the kitchen? Clean up any spills right away. Avoid walking on wet floors. Keep items that you use a lot in easy-to-reach places. If you need to reach something above you, use a strong step stool that has a grab bar. Keep electrical cords out of the way. Do not use floor polish or wax that makes floors slippery. If you must use wax, use non-skid floor wax. Do not have throw rugs and other things on the floor that can make you trip. What can I do with my stairs? Do not leave any items on the stairs. Make sure that there are handrails on both sides of the stairs and use them. Fix handrails that  are broken or loose. Make sure that handrails are as long as the stairways. Check any carpeting to make sure that it is firmly attached to the stairs. Fix any carpet that is loose or worn. Avoid having throw rugs at the top or bottom of the stairs. If you do have throw rugs, attach them to the floor with carpet tape. Make sure that you have a light switch at the top of the stairs and the bottom of the stairs. If you do not have them, ask someone to add them for you. What else can I do to help prevent falls? Wear shoes that: Do not have high heels. Have rubber bottoms. Are comfortable and fit you well. Are closed at the toe. Do not wear sandals. If you use a stepladder: Make sure that it is fully opened.  Do not climb a closed stepladder. Make sure that both sides of the stepladder are locked into place. Ask someone to hold it for you, if possible. Clearly mark and make sure that you can see: Any grab bars or handrails. First and last steps. Where the edge of each step is. Use tools that help you move around (mobility aids) if they are needed. These include: Canes. Walkers. Scooters. Crutches. Turn on the lights when you go into a dark area. Replace any light bulbs as soon as they burn out. Set up your furniture so you have a clear path. Avoid moving your furniture around. If any of your floors are uneven, fix them. If there are any pets around you, be aware of where they are. Review your medicines with your doctor. Some medicines can make you feel dizzy. This can increase your chance of falling. Ask your doctor what other things that you can do to help prevent falls. This information is not intended to replace advice given to you by your health care provider. Make sure you discuss any questions you have with your health care provider. Document Released: 06/28/2009 Document Revised: 02/07/2016 Document Reviewed: 10/06/2014 Elsevier Interactive Patient Education  2017 [employer].

## 2021-10-08 NOTE — Progress Notes (Signed)
Subjective:   Loretta Miles is a 88 y.o. female who presents for an Initial Medicare Annual Wellness Visit.  Virtual Visit via Telephone Note  I connected with  Loretta Miles on 10/08/21 at  9:00 AM EST by telephone and verified that I am speaking with the correct person using two identifiers.  Location: Patient: Home Provider: [facility] Persons participating in the virtual visit: patient/Nurse Health Advisor   I discussed the limitations, risks, security and privacy concerns of performing an evaluation and management service by telephone and the availability of in person appointments. The patient expressed understanding and agreed to proceed.  Interactive audio and video telecommunications were attempted between this nurse and patient, however failed, due to patient having technical difficulties OR patient did not have access to video capability.  We continued and completed visit with audio only.  Some vital signs may be absent or patient reported.   Leighann Amadon E Miyanna Wiersma, LPN   Review of Systems     Cardiac Risk Factors include: advanced age (>23men, >22 women);sedentary lifestyle;dyslipidemia;hypertension     Objective:    Today's Vitals   10/08/21 0904  Weight: 139 lb (63 kg)  Height: 5\' 6"  (1.676 m)   Body mass index is 22.44 kg/m.  Advanced Directives 10/08/2021 03/20/2019 02/12/2019 09/21/2016 07/15/2016 07/11/2016  Does Patient Have a Medical Advance Directive? Yes Yes No No Yes Yes  Type of Paramedic of [place];Living will Living will - - [place];Living will Living will;Healthcare Power of Attorney  Does patient want to make changes to medical advance directive? - No - Patient declined - - No - Patient declined -  Copy of [place] in Chart? No - copy requested - - - No - copy requested -  Would patient like information on creating a medical advance directive? - - - No - Patient declined - -    Current  Medications (verified) Outpatient Encounter Medications as of 10/08/2021  Medication Sig   lisinopril (ZESTRIL) 10 MG tablet Take 1 tablet (10 mg total) by mouth daily.   UNABLE TO FIND Take 2 tablets by mouth 2 (two) times daily. Med Name: [place] Take 2 tablets by mouth daily. Med Name: [place] with tumeric & bacopa   vitamin C (ASCORBIC ACID) 500 MG tablet Take 500 mg by mouth daily.   zinc gluconate 50 MG tablet Take 50 mg by mouth daily.   Red Yeast Rice 600 MG CAPS Take 600 mg by mouth daily. (Patient not taking: Reported on 10/08/2021)   UNABLE TO FIND Take 3 tablets by mouth daily. Med Name: Blood Pressure Support (Patient not taking: Reported on 10/08/2021)   No facility-administered encounter medications on file as of 10/08/2021.    Allergies (verified) Albuterol; Coconut oil; Cortisone; Albuterol sulfate; Antihistamines, chlorpheniramine-type; Chocolate; Cocoa; Coconut flavor; Mint chocolate chip flavor; Prednisone; Tamiflu [oseltamivir]; Tetanus toxoids; and Hydrocodone   History: Past Medical History:  Diagnosis Date   Arthritis    rt hip, lt knee   Cancer ([place])    skin   CKD (chronic kidney disease)    Dysrhythmia 2017   episode of RAF   GERD (gastroesophageal reflux disease)    Headache    rare migraines   Stenosing tenosynovitis of finger of right hand    ring finger   Past Surgical History:  Procedure Laterality Date   JOINT REPLACEMENT     hip replacement   MOUTH SURGERY  TONSILLECTOMY     TOTAL HIP ARTHROPLASTY Right    TRIGGER FINGER RELEASE Right 07/15/2016   Procedure: RELEASE TRIGGER FINGER/A-1 PULLEY RIGHT RING FINGER;  Surgeon: Daryll Brod, MD;  Location: [facility];  Service: Orthopedics;  Laterality: Right;   Family History  Problem Relation Age of Onset   Heart disease Mother    Thyroid disease Mother    Lung cancer Father    Breast cancer Sister    Anxiety disorder Sister    Depression Sister     Thyroid disease Sister    Bipolar disorder Daughter    Anxiety disorder Daughter    Depression Daughter    Throat cancer Maternal Uncle    Prostate cancer Maternal Uncle    Lung cancer Paternal Uncle    Social History   Socioeconomic History   Marital status: Divorced    Spouse name: Not on file   Number of children: 3   Years of education: Not on file   Highest education level: Not on file  Occupational History   Occupation: retired  Tobacco Use   Smoking status: Former    Types: Cigarettes    Quit date: 01/26/1975    Years since quitting: 46.7   Smokeless tobacco: Never  Vaping Use   Vaping Use: Never used  Substance and Sexual Activity   Alcohol use: No   Drug use: No   Sexual activity: Not Currently    Birth control/protection: None  Other Topics Concern   Not on file  Social History Narrative   Lives alone -has a basement   One child 2 miles away - other 2 live out of state   Social Determinants of Health   Financial Resource Strain: Low Risk    Difficulty of Paying Living Expenses: Not hard at all  Food Insecurity: No Food Insecurity   Worried About Charity fundraiser in the Last Year: Never true   [place] in the Last Year: Never true  Transportation Needs: No Transportation Needs   Lack of Transportation (Medical): No   Lack of Transportation (Non-Medical): No  Physical Activity: Insufficiently Active   Days of Exercise per Week: 3 days   Minutes of Exercise per Session: 30 min  Stress: No Stress Concern Present   Feeling of Stress : Not at all  Social Connections: Moderately Integrated   Frequency of Communication with Friends and Family: More than three times a week   Frequency of Social Gatherings with Friends and Family: More than three times a week   Attends Religious Services: More than 4 times per year   Active Member of [employer] or Organizations: Yes   Attends Archivist Meetings: More than 4 times per year   Marital Status:  Widowed    Tobacco Counseling Counseling given: Not Answered   Clinical Intake:  Pre-visit preparation completed: Yes  Pain : No/denies pain     BMI - recorded: 22.44 Nutritional Status: BMI of 19-24  Normal Nutritional Risks: None Diabetes: No  How often do you need to have someone help you when you read instructions, pamphlets, or other written materials from your doctor or pharmacy?: 1 - Never  Diabetic? no  Interpreter Needed?: No  Information entered by :: Leanora Murin, LPN   Activities of Daily Living In your present state of health, do you have any difficulty performing the following activities: 10/08/2021  Hearing? Y  Comment mild - has to have ears cleaned out frequently  Vision? N  Difficulty concentrating or making decisions? Y  Comment mild  Walking or climbing stairs? N  Dressing or bathing? N  Doing errands, shopping? N  Preparing Food and eating ? N  Using the Toilet? N  In the past six months, have you accidently leaked urine? N  Do you have problems with loss of bowel control? N  Managing your Medications? N  Managing your Finances? N  Housekeeping or managing your Housekeeping? N  Some recent data might be hidden    Patient Care Team: Loman Brooklyn, FNP as PCP - General (Family Medicine) Levy Sjogren, MD as Referring Physician (Dermatology)  Indicate any recent Medical Services you may have received from other than [facility] providers in the past year (date may be approximate).     Assessment:   This is a routine wellness examination for Loretta Miles.  Hearing/Vision screen Hearing Screening - Comments:: mild hearing difficulties - has to have ears cleaned out regularly Vision Screening - Comments:: Wears rx glasses - hasn't seen eye doctor since cataract surgery years ago  Dietary issues and exercise activities discussed: Current Exercise Habits: Home exercise routine, Type of exercise: yoga;walking;Other - see comments (gardening,  yard work, house work), Time (Minutes): 30, Frequency (Times/Week): 3, Weekly Exercise (Minutes/Week): 90, Intensity: Mild, Exercise limited by: orthopedic condition(s)   Goals Addressed             This Visit's Progress    Exercise 150 min/wk Moderate Activity         Depression Screen PHQ 2/9 Scores 10/08/2021 08/14/2021 07/02/2021 03/28/2021 01/26/2020  PHQ - 2 Score 0 0 0 0 0  PHQ- 9 Score 1 0 4 4 -    Fall Risk Fall Risk  10/08/2021 08/14/2021 07/02/2021 01/26/2020  Falls in the past year? 0 0 0 0  Number falls in past yr: 0 - - -  Injury with Fall? 0 - - -  Risk for fall due to : Orthopedic patient - - -  Follow up Falls prevention discussed - - -    FALL [place]:  Any stairs in or around the home? Yes  If so, are there any without handrails? No  Home free of loose throw rugs in walkways, pet beds, electrical cords, etc? Yes  Adequate lighting in your home to reduce risk of falls? Yes   ASSISTIVE DEVICES UTILIZED TO PREVENT FALLS:  Life alert? No  Use of a cane, walker or w/c? No  Grab bars in the bathroom? Yes  Shower chair or bench in shower? No  Elevated toilet seat or a handicapped toilet? Yes   TIMED UP AND GO:  Was the test performed? No . Telephonic visit  Cognitive Function:     6CIT Screen 10/08/2021  What Year? 0 points  What month? 0 points  What time? 0 points  Count back from 20 0 points  Months in reverse 0 points  Repeat phrase 0 points  Total Score 0    Immunizations Immunization History  Administered Date(s) Administered   Moderna Sars-Covid-2 Vaccination 10/19/2019, 11/17/2019    TDAP status: Due, Education has been provided regarding the importance of this vaccine. Advised may receive this vaccine at local pharmacy or Health Dept. Aware to provide a copy of the vaccination record if obtained from local pharmacy or Health Dept. Verbalized acceptance and understanding.  Flu Vaccine status: Declined,  Education has been provided regarding the importance of this vaccine but patient still declined. Advised may receive this  vaccine at local pharmacy or Health Dept. Aware to provide a copy of the vaccination record if obtained from local pharmacy or Health Dept. Verbalized acceptance and understanding.  Pneumococcal vaccine status: Due, Education has been provided regarding the importance of this vaccine. Advised may receive this vaccine at local pharmacy or Health Dept. Aware to provide a copy of the vaccination record if obtained from local pharmacy or Health Dept. Verbalized acceptance and understanding.  Covid-19 vaccine status: Completed vaccines  Qualifies for Shingles Vaccine? Yes   Zostavax completed No   Shingrix Completed?: No.    Education has been provided regarding the importance of this vaccine. Patient has been advised to call insurance company to determine out of pocket expense if they have not yet received this vaccine. Advised may also receive vaccine at local pharmacy or Health Dept. Verbalized acceptance and understanding.  Screening Tests Health Maintenance  Topic Date Due   Zoster Vaccines- Shingrix (1 of 2) Never done   COVID-19 Vaccine (3 - Booster for Moderna series) 01/12/2020   INFLUENZA VACCINE  12/13/2021 (Originally 04/15/2021)   DEXA SCAN  07/02/2022 (Originally 01/13/1998)   Pneumonia Vaccine 28+ Years old (1 - PCV) 07/07/2022 (Originally 01/13/1998)   HPV VACCINES  Aged Out   TETANUS/TDAP  Discontinued    Health Maintenance  Health Maintenance Due  Topic Date Due   Zoster Vaccines- Shingrix (1 of 2) Never done   COVID-19 Vaccine (3 - Booster for Moderna series) 01/12/2020    Colorectal cancer screening: No longer required.   Mammogram status: No longer required due to age.  Bone Density Scan: Declined  Lung Cancer Screening: (Low Dose CT Chest recommended if Age 82-80 years, 30 pack-year currently smoking OR have quit w/in 15years.) does not qualify.    Additional Screening:  Hepatitis C Screening: does not qualify  Vision Screening: Recommended annual ophthalmology exams for early detection of glaucoma and other disorders of the eye. Is the patient up to date with their annual eye exam?  No  Who is the provider or what is the name of the office in which the patient attends annual eye exams? None - used to see someone on [address] in [place] If pt is not established with a provider, would they like to be referred to a provider to establish care? No .   Dental Screening: Recommended annual dental exams for proper oral hygiene  Community Resource Referral / Chronic Care Management: CRR required this visit?  No   CCM required this visit?  No      Plan:     I have personally reviewed and noted the following in the patients chart:   Medical and social history Use of alcohol, tobacco or illicit drugs  Current medications and supplements including opioid prescriptions. Patient is not currently taking opioid prescriptions. Functional ability and status Nutritional status Physical activity Advanced directives List of other physicians Hospitalizations, surgeries, and ER visits in previous 12 months Vitals Screenings to include cognitive, depression, and falls Referrals and appointments  In addition, I have reviewed and discussed with patient certain preventive protocols, quality metrics, and best practice recommendations. A written personalized care plan for preventive services as well as general preventive health recommendations were provided to patient.     Sandrea Hammond, LPN   0/05/2329   Nurse Notes: None

## 2021-10-15 ENCOUNTER — Encounter: Payer: Self-pay | Admitting: Family Medicine

## 2021-10-15 ENCOUNTER — Ambulatory Visit (INDEPENDENT_AMBULATORY_CARE_PROVIDER_SITE_OTHER): Payer: Medicare Other | Admitting: Family Medicine

## 2021-10-15 VITALS — BP 154/74 | HR 74 | Temp 96.1°F | Ht 66.0 in | Wt 137.0 lb

## 2021-10-15 DIAGNOSIS — Z7189 Other specified counseling: Secondary | ICD-10-CM

## 2021-10-15 DIAGNOSIS — I10 Essential (primary) hypertension: Secondary | ICD-10-CM

## 2021-10-15 MED ORDER — LISINOPRIL 20 MG PO TABS: 20.0000 mg | ORAL_TABLET | Freq: Every day | ORAL | 2 refills | Status: DC

## 2021-10-15 NOTE — Progress Notes (Signed)
Assessment & Plan:  1. Essential hypertension Uncontrolled, but improving. Lisinopril increased from 10 mg to 20 mg daily. Education provided on the DASH diet.  - lisinopril (ZESTRIL) 20 MG tablet; Take 1 tablet (20 mg total) by mouth daily.  Dispense: 30 tablet; Refill: 2  2. ACP (advance care planning) DNR to be scanned in. Advance directive documents provided for patient to complete.  - DNR (Do Not Resuscitate)   Return in about 2 months (around 12/13/2021) for follow-up of chronic medication conditions.  Hendricks Limes, MSN, APRN, FNP-C [facility]  Subjective:    Patient ID: Loretta Miles, female    DOB: [birth year], 88 y.o.   MRN: [id number]  Patient Care Team: Loman Brooklyn, FNP as PCP - General (Family Medicine) Levy Sjogren, MD as Referring Physician (Dermatology)   Chief Complaint:  Chief Complaint  Patient presents with   Hypertension    2 month follow up     HPI: Loretta Miles is a 88 y.o. female presenting on 10/15/2021 for Hypertension (2 month follow up )  Hypertension Patient has been monitoring her blood pressure at home. She reports readings are 150s/70s. She has been taking multiple new supplements so try to improve her health without prescription medication. She was started on  Lisinopril 10 mg at our last visit.  New complaints: None   Social history:  Relevant past medical, surgical, family and social history reviewed and updated as indicated. Interim medical history since our last visit reviewed.  Allergies and medications reviewed and updated.  DATA REVIEWED: CHART IN EPIC  ROS: Negative unless specifically indicated above in HPI.    Current Outpatient Medications:    lisinopril (ZESTRIL) 10 MG tablet, Take 1 tablet (10 mg total) by mouth daily., Disp: 30 tablet, Rfl: 2   Red Yeast Rice 600 MG CAPS, Take 600 mg by mouth daily., Disp: , Rfl:    UNABLE TO FIND, Take 2 tablets by mouth 2 (two) times daily.  Med Name: [place], Disp: , Rfl:    UNABLE TO FIND, Take 3 tablets by mouth daily. Med Name: Blood Pressure Support, Disp: , Rfl:    UNABLE TO FIND, Take 2 tablets by mouth daily. Med Name: [place] with tumeric & bacopa, Disp: , Rfl:    vitamin C (ASCORBIC ACID) 500 MG tablet, Take 500 mg by mouth daily., Disp: , Rfl:    zinc gluconate 50 MG tablet, Take 50 mg by mouth daily., Disp: , Rfl:    Allergies  Allergen Reactions   Albuterol Other (See Comments)    Experienced severe chest tightness and difficulty breathing.  Experienced severe chest tightness and difficulty breathing.     Coconut Oil Other (See Comments)    CAUSES ACID REFLUX AND VISION PROBLEMS CAUSES ACID REFLUX AND VISION PROBLEMS    Cortisone Other (See Comments)    High dose   Albuterol Sulfate    Antihistamines, Chlorpheniramine-Type Other (See Comments)    BP went up Elevates blood pressure   Chocolate    Cocoa    Coconut Flavor    [place] Chocolate Chip Flavor    Prednisone Other (See Comments)    Caused "me to be crazy"   Tamiflu [Oseltamivir] Other (See Comments)    Caused heart to be irreg, bp to go up   Tetanus Toxoids    Hydrocodone Nausea Only   Past Medical History:  Diagnosis Date   Arthritis    rt hip, lt knee  Cancer ([place])    skin   CKD (chronic kidney disease)    Dysrhythmia 2017   episode of RAF   GERD (gastroesophageal reflux disease)    Headache    rare migraines   Stenosing tenosynovitis of finger of right hand    ring finger    Past Surgical History:  Procedure Laterality Date   JOINT REPLACEMENT     hip replacement   MOUTH SURGERY     TONSILLECTOMY     TOTAL HIP ARTHROPLASTY Right    TRIGGER FINGER RELEASE Right 07/15/2016   Procedure: RELEASE TRIGGER FINGER/A-1 PULLEY RIGHT RING FINGER;  Surgeon: Daryll Brod, MD;  Location: [facility];  Service: Orthopedics;  Laterality: Right;    Social History   Socioeconomic History   Marital status: Divorced     Spouse name: Not on file   Number of children: 3   Years of education: Not on file   Highest education level: Not on file  Occupational History   Occupation: retired  Tobacco Use   Smoking status: Former    Types: Cigarettes    Quit date: 01/26/1975    Years since quitting: 46.7   Smokeless tobacco: Never  Vaping Use   Vaping Use: Never used  Substance and Sexual Activity   Alcohol use: No   Drug use: No   Sexual activity: Not Currently    Birth control/protection: None  Other Topics Concern   Not on file  Social History Narrative   Lives alone -has a basement   One child 2 miles away - other 2 live out of state   Social Determinants of Health   Financial Resource Strain: Low Risk    Difficulty of Paying Living Expenses: Not hard at all  Food Insecurity: No Food Insecurity   Worried About Charity fundraiser in the Last Year: Never true   [place] in the Last Year: Never true  Transportation Needs: No Transportation Needs   Lack of Transportation (Medical): No   Lack of Transportation (Non-Medical): No  Physical Activity: Insufficiently Active   Days of Exercise per Week: 3 days   Minutes of Exercise per Session: 30 min  Stress: No Stress Concern Present   Feeling of Stress : Not at all  Social Connections: Moderately Integrated   Frequency of Communication with Friends and Family: More than three times a week   Frequency of Social Gatherings with Friends and Family: More than three times a week   Attends Religious Services: More than 4 times per year   Active Member of [employer] or Organizations: Yes   Attends Archivist Meetings: More than 4 times per year   Marital Status: Widowed  Human resources officer Violence: Not At Risk   Fear of Current or Ex-Partner: No   Emotionally Abused: No   Physically Abused: No   Sexually Abused: No        Objective:    BP (!) 154/74    Pulse 74    Temp (!) 96.1 F (35.6 C) (Temporal)    Ht '5\' 6"'  (1.676 m)    Wt  137 lb (62.1 kg)    SpO2 97%    BMI 22.11 kg/m   Wt Readings from Last 3 Encounters:  10/15/21 137 lb (62.1 kg)  10/08/21 139 lb (63 kg)  08/14/21 139 lb 12.8 oz (63.4 kg)    Physical Exam Vitals reviewed.  Constitutional:      General: She is not in acute  distress.    Appearance: Normal appearance. She is normal weight. She is not ill-appearing, toxic-appearing or diaphoretic.  HENT:     Head: Normocephalic and atraumatic.  Eyes:     General: No scleral icterus.       Right eye: No discharge.        Left eye: No discharge.     Conjunctiva/sclera: Conjunctivae normal.  Cardiovascular:     Rate and Rhythm: Normal rate and regular rhythm.     Heart sounds: Normal heart sounds. No murmur heard.   No friction rub. No gallop.  Pulmonary:     Effort: Pulmonary effort is normal. No respiratory distress.     Breath sounds: Normal breath sounds. No stridor. No wheezing, rhonchi or rales.  Musculoskeletal:        General: Normal range of motion.     Cervical back: Normal range of motion.  Skin:    General: Skin is warm and dry.     Capillary Refill: Capillary refill takes less than 2 seconds.  Neurological:     General: No focal deficit present.     Mental Status: She is alert and oriented to person, place, and time. Mental status is at baseline.  Psychiatric:        Mood and Affect: Mood normal.        Behavior: Behavior normal.        Thought Content: Thought content normal.        Judgment: Judgment normal.    No results found for: TSH Lab Results  Component Value Date   WBC 4.7 08/14/2021   HGB 13.0 08/14/2021   HCT 38.2 08/14/2021   MCV 91 08/14/2021   PLT 222 08/14/2021   Lab Results  Component Value Date   NA 140 08/28/2021   K 4.8 08/28/2021   CO2 23 08/28/2021   GLUCOSE 61 (L) 08/28/2021   BUN 16 08/28/2021   CREATININE 0.95 08/28/2021   BILITOT 0.7 08/14/2021   ALKPHOS 59 08/14/2021   AST 21 08/14/2021   ALT 14 08/14/2021   PROT 6.8 08/14/2021    ALBUMIN 4.4 08/14/2021   CALCIUM 10.1 08/28/2021   EGFR 58 (L) 08/28/2021   Lab Results  Component Value Date   CHOL 237 (H) 08/14/2021   Lab Results  Component Value Date   HDL 59 08/14/2021   Lab Results  Component Value Date   LDLCALC 162 (H) 08/14/2021   Lab Results  Component Value Date   TRIG 90 08/14/2021   Lab Results  Component Value Date   CHOLHDL 4.0 08/14/2021   No results found for: HGBA1C

## 2021-10-21 ENCOUNTER — Ambulatory Visit (INDEPENDENT_AMBULATORY_CARE_PROVIDER_SITE_OTHER): Payer: Medicare Other | Admitting: *Deleted

## 2021-10-21 DIAGNOSIS — Z23 Encounter for immunization: Secondary | ICD-10-CM

## 2021-10-21 NOTE — Progress Notes (Signed)
Pt given Shingles vaccine IM left deltoid and tolerated well.

## 2021-10-23 DIAGNOSIS — H6123 Impacted cerumen, bilateral: Secondary | ICD-10-CM | POA: Diagnosis not present

## 2021-11-22 ENCOUNTER — Other Ambulatory Visit: Payer: Self-pay | Admitting: Family Medicine

## 2021-11-22 DIAGNOSIS — I10 Essential (primary) hypertension: Secondary | ICD-10-CM

## 2021-12-05 ENCOUNTER — Ambulatory Visit (INDEPENDENT_AMBULATORY_CARE_PROVIDER_SITE_OTHER): Payer: Medicare Other | Admitting: Family Medicine

## 2021-12-05 ENCOUNTER — Encounter: Payer: Self-pay | Admitting: Family Medicine

## 2021-12-05 VITALS — BP 138/70 | HR 63 | Temp 97.2°F | Ht 66.0 in | Wt 139.6 lb

## 2021-12-05 DIAGNOSIS — J069 Acute upper respiratory infection, unspecified: Secondary | ICD-10-CM

## 2021-12-05 DIAGNOSIS — R051 Acute cough: Secondary | ICD-10-CM | POA: Diagnosis not present

## 2021-12-05 NOTE — Progress Notes (Signed)
? ?Assessment & Plan:  ?1. Viral URI ?Discussed symptom management and typical duration of viral illnesses. ? ?2. Acute cough ?- Novel Coronavirus, NAA ([facility]) ? ? ?Follow up plan: Return if symptoms worsen or fail to improve. ? ?Hendricks Limes, MSN, APRN, FNP-C ?[facility] ? ?Subjective:  ? ?Patient ID: Loretta Miles, female    DOB: August 22, 1931, 88 y.o.   MRN: [id number] ? ?HPI: ?Loretta Miles is a 88 y.o. female presenting on 12/05/2021 for Cough, Fatigue, and Shortness of Breath (Patient states it started last night ) ? ?Patient complains of cough, shortness of breath, and fatigue . Onset of symptoms was last night, unchanged since that time. She is drinking plenty of fluids. Evaluation to date: none. Treatment to date: none.  She does not smoke.  ? ? ?ROS: Negative unless specifically indicated above in HPI.  ? ?Relevant past medical history reviewed and updated as indicated.  ? ?Allergies and medications reviewed and updated. ? ? ?Current Outpatient Medications:  ?  Apoaequorin (PREVAGEN PO), Take 1 capsule by mouth daily., Disp: , Rfl:  ?  lisinopril (ZESTRIL) 20 MG tablet, Take 1 tablet (20 mg total) by mouth daily., Disp: 30 tablet, Rfl: 2 ?  UNABLE TO FIND, Take 2 capsules by mouth daily. Med Name: Lung & Respiration Advanced Respiratory Support, Disp: , Rfl:  ?  vitamin C (ASCORBIC ACID) 500 MG tablet, Take 500 mg by mouth daily., Disp: , Rfl:  ?  zinc gluconate 50 MG tablet, Take 50 mg by mouth daily., Disp: , Rfl:  ? ?Allergies  ?Allergen Reactions  ? Albuterol Other (See Comments)  ?  Experienced severe chest tightness and difficulty breathing.  ?Experienced severe chest tightness and difficulty breathing.  ?  ? Coconut Oil Other (See Comments)  ?  CAUSES ACID REFLUX AND VISION PROBLEMS ?CAUSES ACID REFLUX AND VISION PROBLEMS ?  ? Cortisone Other (See Comments)  ?  High dose  ? Albuterol Sulfate   ? Antihistamines, Chlorpheniramine-Type Other (See Comments)  ?  BP went  up ?Elevates blood pressure  ? Chocolate   ? Cocoa   ? Coconut Flavor   ? Mint Chocolate Chip Flavor   ? Prednisone Other (See Comments)  ?  Caused "me to be crazy"  ? Tamiflu [Oseltamivir] Other (See Comments)  ?  Caused heart to be irreg, bp to go up  ? Tetanus Toxoids   ? Hydrocodone Nausea Only  ? ? ?Objective:  ? ?BP 138/70   Pulse 63   Temp (!) 97.2 ?F (36.2 ?C) (Temporal)   Ht '5\' 6"'$  (1.676 m)   Wt 139 lb 9.6 oz (63.3 kg)   SpO2 97%   BMI 22.53 kg/m?   ? ?Physical Exam ?Vitals reviewed.  ?Constitutional:   ?   General: She is not in acute distress. ?   Appearance: Normal appearance. She is not ill-appearing, toxic-appearing or diaphoretic.  ?HENT:  ?   Head: Normocephalic and atraumatic.  ?Eyes:  ?   General: No scleral icterus.    ?   Right eye: No discharge.     ?   Left eye: No discharge.  ?   Conjunctiva/sclera: Conjunctivae normal.  ?Cardiovascular:  ?   Rate and Rhythm: Normal rate and regular rhythm.  ?   Heart sounds: Normal heart sounds. No murmur heard. ?  No friction rub. No gallop.  ?Pulmonary:  ?   Effort: Pulmonary effort is normal. No respiratory distress.  ?   Breath sounds: Normal  breath sounds. No stridor. No wheezing, rhonchi or rales.  ?Musculoskeletal:     ?   General: Normal range of motion.  ?   Cervical back: Normal range of motion.  ?Skin: ?   General: Skin is warm and dry.  ?   Capillary Refill: Capillary refill takes less than 2 seconds.  ?Neurological:  ?   General: No focal deficit present.  ?   Mental Status: She is alert and oriented to person, place, and time. Mental status is at baseline.  ?Psychiatric:     ?   Mood and Affect: Mood normal.     ?   Behavior: Behavior normal.     ?   Thought Content: Thought content normal.     ?   Judgment: Judgment normal.  ? ? ? ? ? ? ?

## 2021-12-06 LAB — NOVEL CORONAVIRUS, NAA: SARS-CoV-2, NAA: NOT DETECTED

## 2021-12-09 ENCOUNTER — Telehealth: Payer: Self-pay | Admitting: Family Medicine

## 2021-12-09 NOTE — Telephone Encounter (Signed)
She is currently still in the window of a viral illness. If symptoms are no better by Wednesday, let me know. ?

## 2021-12-09 NOTE — Telephone Encounter (Signed)
Patient reports she has an appointment to come in tomorrow, daughter is afraid she may have pneumonia ?

## 2021-12-09 NOTE — Telephone Encounter (Signed)
Pt returned missed call. Nurse unavailable to take call. Please call pt back when available.  ?

## 2021-12-09 NOTE — Telephone Encounter (Signed)
Pt called stating that she had a visit with PCP last week for cough and congestion. Pt says her congestion is not any better and needs PCP to send medicine to [facility] [place].  ?

## 2021-12-10 ENCOUNTER — Other Ambulatory Visit: Payer: Self-pay | Admitting: Family Medicine

## 2021-12-10 ENCOUNTER — Telehealth: Payer: Self-pay | Admitting: Family Medicine

## 2021-12-10 ENCOUNTER — Ambulatory Visit (INDEPENDENT_AMBULATORY_CARE_PROVIDER_SITE_OTHER): Payer: Medicare Other | Admitting: Family Medicine

## 2021-12-10 ENCOUNTER — Encounter: Payer: Self-pay | Admitting: Family Medicine

## 2021-12-10 ENCOUNTER — Ambulatory Visit (INDEPENDENT_AMBULATORY_CARE_PROVIDER_SITE_OTHER): Payer: Medicare Other

## 2021-12-10 VITALS — BP 150/79 | HR 76 | Temp 98.3°F | Ht 66.0 in | Wt 137.4 lb

## 2021-12-10 DIAGNOSIS — R051 Acute cough: Secondary | ICD-10-CM

## 2021-12-10 DIAGNOSIS — R0602 Shortness of breath: Secondary | ICD-10-CM

## 2021-12-10 DIAGNOSIS — R0989 Other specified symptoms and signs involving the circulatory and respiratory systems: Secondary | ICD-10-CM

## 2021-12-10 DIAGNOSIS — R0981 Nasal congestion: Secondary | ICD-10-CM

## 2021-12-10 DIAGNOSIS — R059 Cough, unspecified: Secondary | ICD-10-CM | POA: Diagnosis not present

## 2021-12-10 DIAGNOSIS — I10 Essential (primary) hypertension: Secondary | ICD-10-CM

## 2021-12-10 MED ORDER — FLUTICASONE PROPIONATE 50 MCG/ACT NA SUSP: 1.0000 | Freq: Every day | NASAL | 6 refills | Status: AC

## 2021-12-10 MED ORDER — AMOXICILLIN-POT CLAVULANATE 875-125 MG PO TABS: 1.0000 | ORAL_TABLET | Freq: Two times a day (BID) | ORAL | 0 refills | Status: AC

## 2021-12-10 NOTE — Patient Instructions (Signed)

## 2021-12-10 NOTE — Telephone Encounter (Signed)
Refer to lab results.  

## 2021-12-10 NOTE — Progress Notes (Signed)
? ?Acute Office Visit ? ?Subjective:  ? ? Patient ID: Loretta Miles, female    DOB: [birth year], 88 y.o.   MRN: [id number] ? ?Chief Complaint  ?Patient presents with  ? Cough  ? Shortness of Breath  ? ? ?HPI ?Here with daughter. Patient is in today for for cough, congestion, shortness of breath, and fatigue x 5 days. She was seen by her PCP on 12/05/21 the day after her symptoms started. She had a negative Covid test and was given instructions for symptomatic care. She is getting shortness of breath with activity. She feels like she has no energy. Denies vomiting, diarrhea, sore throat, dysuria, focal weakness, or chest pain. She has been staying well hydrated. She has not tried any remedies.  ? ?Past Medical History:  ?Diagnosis Date  ? Arthritis   ? rt hip, lt knee  ? Cancer [facility])   ? skin  ? CKD (chronic kidney disease)   ? Dysrhythmia 2017  ? episode of RAF  ? GERD (gastroesophageal reflux disease)   ? Headache   ? rare migraines  ? Stenosing tenosynovitis of finger of right hand   ? ring finger  ? ? ?Past Surgical History:  ?Procedure Laterality Date  ? JOINT REPLACEMENT    ? hip replacement  ? MOUTH SURGERY    ? TONSILLECTOMY    ? TOTAL HIP ARTHROPLASTY Right   ? TRIGGER FINGER RELEASE Right 07/15/2016  ? Procedure: RELEASE TRIGGER FINGER/A-1 PULLEY RIGHT RING FINGER;  Surgeon: Daryll Brod, MD;  Location: [place];  Service: Orthopedics;  Laterality: Right;  ? ? ?Family History  ?Problem Relation Age of Onset  ? Heart disease Mother   ? Thyroid disease Mother   ? Lung cancer Father   ? Breast cancer Sister   ? Anxiety disorder Sister   ? Depression Sister   ? Thyroid disease Sister   ? Bipolar disorder Daughter   ? Anxiety disorder Daughter   ? Depression Daughter   ? Throat cancer Maternal Uncle   ? Prostate cancer Maternal Uncle   ? Lung cancer Paternal Uncle   ? ? ?Social History  ? ?Socioeconomic History  ? Marital status: Divorced  ?  Spouse name: Not on file  ? Number of children: 3  ? Years  of education: Not on file  ? Highest education level: Not on file  ?Occupational History  ? Occupation: retired  ?Tobacco Use  ? Smoking status: Former  ?  Types: Cigarettes  ?  Quit date: 01/26/1975  ?  Years since quitting: 46.9  ? Smokeless tobacco: Never  ?Vaping Use  ? Vaping Use: Never used  ?Substance and Sexual Activity  ? Alcohol use: No  ? Drug use: No  ? Sexual activity: Not Currently  ?  Birth control/protection: None  ?Other Topics Concern  ? Not on file  ?Social History Narrative  ? Lives alone -has a basement  ? One child 2 miles away - other 2 live out of state  ? ?Social Determinants of Health  ? ?Financial Resource Strain: Low Risk   ? Difficulty of Paying Living Expenses: Not hard at all  ?Food Insecurity: No Food Insecurity  ? Worried About Charity fundraiser in the Last Year: Never true  ? Ran Out of Food in the Last Year: Never true  ?Transportation Needs: No Transportation Needs  ? Lack of Transportation (Medical): No  ? Lack of Transportation (Non-Medical): No  ?Physical Activity: Insufficiently Active  ? Days  of Exercise per Week: 3 days  ? Minutes of Exercise per Session: 30 min  ?Stress: No Stress Concern Present  ? Feeling of Stress : Not at all  ?Social Connections: Moderately Integrated  ? Frequency of Communication with Friends and Family: More than three times a week  ? Frequency of Social Gatherings with Friends and Family: More than three times a week  ? Attends Religious Services: More than 4 times per year  ? Active Member of Clubs or Organizations: Yes  ? Attends Archivist Meetings: More than 4 times per year  ? Marital Status: Widowed  ?Intimate Partner Violence: Not At Risk  ? Fear of Current or Ex-Partner: No  ? Emotionally Abused: No  ? Physically Abused: No  ? Sexually Abused: No  ? ? ?Outpatient Medications Prior to Visit  ?Medication Sig Dispense Refill  ? Apoaequorin (PREVAGEN PO) Take 1 capsule by mouth daily.    ? lisinopril (ZESTRIL) 20 MG tablet Take 1  tablet (20 mg total) by mouth daily. 30 tablet 2  ? UNABLE TO FIND Take 2 capsules by mouth daily. Med Name: Lung & Respiration Advanced Respiratory Support    ? vitamin C (ASCORBIC ACID) 500 MG tablet Take 500 mg by mouth daily.    ? zinc gluconate 50 MG tablet Take 50 mg by mouth daily.    ? ?No facility-administered medications prior to visit.  ? ? ?Allergies  ?Allergen Reactions  ? Albuterol Other (See Comments)  ?  Experienced severe chest tightness and difficulty breathing.  ?Experienced severe chest tightness and difficulty breathing.  ?  ? Coconut Oil Other (See Comments)  ?  CAUSES ACID REFLUX AND VISION PROBLEMS ?CAUSES ACID REFLUX AND VISION PROBLEMS ?  ? Cortisone Other (See Comments)  ?  High dose  ? Albuterol Sulfate   ? Antihistamines, Chlorpheniramine-Type Other (See Comments)  ?  BP went up ?Elevates blood pressure  ? Chocolate   ? Cocoa   ? Coconut Flavor   ? Mint Chocolate Chip Flavor   ? Prednisone Other (See Comments)  ?  Caused "me to be crazy"  ? Tamiflu [Oseltamivir] Other (See Comments)  ?  Caused heart to be irreg, bp to go up  ? Tetanus Toxoids   ? Hydrocodone Nausea Only  ? ? ?Review of Systems ?As per HPI.  ?   ?Objective:  ?  ?Physical Exam ?Vitals and nursing note reviewed.  ?Constitutional:   ?   General: She is not in acute distress. ?   Appearance: She is ill-appearing. She is not toxic-appearing or diaphoretic.  ?HENT:  ?   Head: Normocephalic and atraumatic.  ?Eyes:  ?   Extraocular Movements: Extraocular movements intact.  ?   Pupils: Pupils are equal, round, and reactive to light.  ?Cardiovascular:  ?   Rate and Rhythm: Normal rate and regular rhythm.  ?Pulmonary:  ?   Effort: Pulmonary effort is normal. No respiratory distress.  ?   Breath sounds: Examination of the right-upper field reveals rhonchi. Examination of the left-upper field reveals rhonchi. Examination of the right-middle field reveals rhonchi. Examination of the left-middle field reveals rhonchi. Examination of the  right-lower field reveals rhonchi. Examination of the left-lower field reveals rhonchi. Rhonchi present.  ?Chest:  ?   Chest wall: No tenderness or edema.  ?Musculoskeletal:  ?   Right lower leg: No tenderness. No edema.  ?   Left lower leg: No tenderness. No edema.  ?Skin: ?   General: Skin is  warm and dry.  ?Neurological:  ?   General: No focal deficit present.  ?   Mental Status: She is alert and oriented to person, place, and time.  ?Psychiatric:     ?   Mood and Affect: Mood normal.     ?   Behavior: Behavior normal.  ? ? ?BP (!) 150/79   Pulse 76   Temp 98.3 ?F (36.8 ?C) (Temporal)   Ht '5\' 6"'  (1.676 m)   Wt 137 lb 6 oz (62.3 kg)   SpO2 98%   BMI 22.17 kg/m?  ?Wt Readings from Last 3 Encounters:  ?12/10/21 137 lb 6 oz (62.3 kg)  ?12/05/21 139 lb 9.6 oz (63.3 kg)  ?10/15/21 137 lb (62.1 kg)  ? ? ?Health Maintenance Due  ?Topic Date Due  ? COVID-19 Vaccine (4 - Booster for Moderna series) 09/26/2020  ? ? ?There are no preventive care reminders to display for this patient. ? ? ?No results found for: TSH ?Lab Results  ?Component Value Date  ? WBC 4.7 08/14/2021  ? HGB 13.0 08/14/2021  ? HCT 38.2 08/14/2021  ? MCV 91 08/14/2021  ? PLT 222 08/14/2021  ? ?Lab Results  ?Component Value Date  ? NA 140 08/28/2021  ? K 4.8 08/28/2021  ? CO2 23 08/28/2021  ? GLUCOSE 61 (L) 08/28/2021  ? BUN 16 08/28/2021  ? CREATININE 0.95 08/28/2021  ? BILITOT 0.7 08/14/2021  ? ALKPHOS 59 08/14/2021  ? AST 21 08/14/2021  ? ALT 14 08/14/2021  ? PROT 6.8 08/14/2021  ? ALBUMIN 4.4 08/14/2021  ? CALCIUM 10.1 08/28/2021  ? EGFR 58 (L) 08/28/2021  ? ?Lab Results  ?Component Value Date  ? CHOL 237 (H) 08/14/2021  ? ?Lab Results  ?Component Value Date  ? HDL 59 08/14/2021  ? ?Lab Results  ?Component Value Date  ? LDLCALC 162 (H) 08/14/2021  ? ?Lab Results  ?Component Value Date  ? TRIG 90 08/14/2021  ? ?Lab Results  ?Component Value Date  ? CHOLHDL 4.0 08/14/2021  ? ?No results found for: HGBA1C ? ?   ?Assessment & Plan:  ? ?Anistyn was seen  today for cough and shortness of breath. ? ?Diagnoses and all orders for this visit: ? ?Acute cough ?Shortness of breath ?Chest congestion ?Nasal congestion ?CXR negative for acute findings- no signs on pneu

## 2021-12-11 LAB — BMP8+EGFR
BUN/Creatinine Ratio: 23 (ref 12–28)
BUN: 23 mg/dL (ref 8–27)
CO2: 24 mmol/L (ref 20–29)
Calcium: 10.1 mg/dL (ref 8.7–10.3)
Chloride: 105 mmol/L (ref 96–106)
Creatinine, Ser: 1.01 mg/dL — ABNORMAL HIGH (ref 0.57–1.00)
Glucose: 93 mg/dL (ref 70–99)
Potassium: 5.1 mmol/L (ref 3.5–5.2)
Sodium: 141 mmol/L (ref 134–144)
eGFR: 54 mL/min/{1.73_m2} — ABNORMAL LOW (ref >59)

## 2021-12-13 ENCOUNTER — Ambulatory Visit: Payer: Medicare Other | Admitting: Family Medicine

## 2021-12-18 ENCOUNTER — Ambulatory Visit (INDEPENDENT_AMBULATORY_CARE_PROVIDER_SITE_OTHER): Payer: Medicare Other | Admitting: Family Medicine

## 2021-12-18 ENCOUNTER — Encounter: Payer: Self-pay | Admitting: Family Medicine

## 2021-12-18 VITALS — BP 147/76 | HR 75 | Temp 97.9°F | Ht 66.0 in | Wt 137.2 lb

## 2021-12-18 DIAGNOSIS — N1831 Chronic kidney disease, stage 3a: Secondary | ICD-10-CM

## 2021-12-18 DIAGNOSIS — E785 Hyperlipidemia, unspecified: Secondary | ICD-10-CM

## 2021-12-18 DIAGNOSIS — J302 Other seasonal allergic rhinitis: Secondary | ICD-10-CM

## 2021-12-18 DIAGNOSIS — R0602 Shortness of breath: Secondary | ICD-10-CM

## 2021-12-18 DIAGNOSIS — I10 Essential (primary) hypertension: Secondary | ICD-10-CM | POA: Diagnosis not present

## 2021-12-18 MED ORDER — LEVOCETIRIZINE DIHYDROCHLORIDE 5 MG PO TABS: 5.0000 mg | ORAL_TABLET | Freq: Every evening | ORAL | 2 refills | Status: AC

## 2021-12-18 MED ORDER — LISINOPRIL 20 MG PO TABS: 20.0000 mg | ORAL_TABLET | Freq: Every day | ORAL | 1 refills | Status: AC

## 2021-12-18 MED ORDER — LEVALBUTEROL TARTRATE 45 MCG/ACT IN AERO: 2.0000 | INHALATION_SPRAY | RESPIRATORY_TRACT | 0 refills | Status: AC | PRN

## 2021-12-18 NOTE — Progress Notes (Signed)
? ?Assessment & Plan:  ?1. Essential hypertension ?Well controlled on current regimen.  ?- lisinopril (ZESTRIL) 20 MG tablet; Take 1 tablet (20 mg total) by mouth daily.  Dispense: 90 tablet; Refill: 1 ? ?2. Dyslipidemia ?Lifestyle modifications. Medication not indicated. ? ?3. Stage 3a chronic kidney disease ([place]) ?Stable. ? ?4. Seasonal allergies ?Uncontrolled. Started Xyzal daily. ?- levocetirizine (XYZAL) 5 MG tablet; Take 1 tablet (5 mg total) by mouth every evening.  Dispense: 30 tablet; Refill: 2 ? ?5. Shortness of breath ?- levalbuterol (XOPENEX HFA) 45 MCG/ACT inhaler; Inhale 2 puffs into the lungs every 4 (four) hours as needed for wheezing or shortness of breath.  Dispense: 15 g; Refill: 0 ? ? ?Return in about 3 months (around 03/19/2022) for annual physical. ? ?Hendricks Limes, MSN, APRN, FNP-C ?[place] ? ?Subjective:  ? ? Patient ID: Loretta Miles, female    DOB: 11-09-1930, 88 y.o.   MRN: [id number] ? ?Patient Care Team: ?Loman Brooklyn, FNP as PCP - General (Family Medicine) ?Levy Sjogren, MD as Referring Physician (Dermatology)  ? ?Chief Complaint:  ?Chief Complaint  ?Patient presents with  ? Medical Management of Chronic Issues  ? URI  ?  Patient was seen on 3/23 and still have cough & chest congestion   ? ? ?HPI: ?Loretta Miles is a 88 y.o. female presenting on 12/18/2021 for Medical Management of Chronic Issues and URI (Patient was seen on 3/23 and still have cough & chest congestion ) ? ?Hypertension: taking Lisinopril 10 mg daily. She does monitor her blood pressure at home and reports similar readings to what we got today.  ? ?Dyslipidemia: not on medication to treat. ? ?CKD: stage 3a.  ? ?New complaints: ?Patient complains of cough, chest congestion, and shortness of breath. Onset of symptoms was 2 weeks ago, gradually improving since that time. She is drinking plenty of fluids. Evaluation to date: seen previously and thought to have a viral URI, then seen  again later and treated with antibiotics. Treatment to date: antibiotics and nasal steroids. She does not smoke.  ? ? ?Social history: ? ?Relevant past medical, surgical, family and social history reviewed and updated as indicated. Interim medical history since our last visit reviewed. ? ?Allergies and medications reviewed and updated. ? ?DATA REVIEWED: CHART IN EPIC ? ?ROS: Negative unless specifically indicated above in HPI.  ? ? ?Current Outpatient Medications:  ?  Apoaequorin (PREVAGEN PO), Take 1 capsule by mouth daily., Disp: , Rfl:  ?  fluticasone (FLONASE) 50 MCG/ACT nasal spray, Place 1 spray into both nostrils daily., Disp: 16 g, Rfl: 6 ?  lisinopril (ZESTRIL) 20 MG tablet, Take 1 tablet (20 mg total) by mouth daily., Disp: 30 tablet, Rfl: 2 ?  UNABLE TO FIND, Take 2 capsules by mouth daily. Med Name: Lung & Respiration Advanced Respiratory Support, Disp: , Rfl:  ?  vitamin C (ASCORBIC ACID) 500 MG tablet, Take 500 mg by mouth daily., Disp: , Rfl:  ?  zinc gluconate 50 MG tablet, Take 50 mg by mouth daily., Disp: , Rfl:   ? ?Allergies  ?Allergen Reactions  ? Albuterol Other (See Comments)  ?  Experienced severe chest tightness and difficulty breathing.  ?Experienced severe chest tightness and difficulty breathing.  ?  ? Coconut Oil Other (See Comments)  ?  CAUSES ACID REFLUX AND VISION PROBLEMS ?CAUSES ACID REFLUX AND VISION PROBLEMS ?  ? Cortisone Other (See Comments)  ?  High dose  ? Albuterol Sulfate   ?  Antihistamines, Chlorpheniramine-Type Other (See Comments)  ?  BP went up ?Elevates blood pressure  ? Chocolate   ? Cocoa   ? Coconut Flavor   ? Mint Chocolate Chip Flavor   ? Prednisone Other (See Comments)  ?  Caused "me to be crazy"  ? Tamiflu [Oseltamivir] Other (See Comments)  ?  Caused heart to be irreg, bp to go up  ? Tetanus Toxoids   ? Hydrocodone Nausea Only  ? ?Past Medical History:  ?Diagnosis Date  ? Arthritis   ? rt hip, lt knee  ? Cancer [facility])   ? skin  ? CKD (chronic kidney disease)   ?  Dysrhythmia 2017  ? episode of RAF  ? GERD (gastroesophageal reflux disease)   ? Headache   ? rare migraines  ? Stenosing tenosynovitis of finger of right hand   ? ring finger  ?  ?Past Surgical History:  ?Procedure Laterality Date  ? JOINT REPLACEMENT    ? hip replacement  ? MOUTH SURGERY    ? TONSILLECTOMY    ? TOTAL HIP ARTHROPLASTY Right   ? TRIGGER FINGER RELEASE Right 07/15/2016  ? Procedure: RELEASE TRIGGER FINGER/A-1 PULLEY RIGHT RING FINGER;  Surgeon: Daryll Brod, MD;  Location: [place];  Service: Orthopedics;  Laterality: Right;  ?  ?Social History  ? ?Socioeconomic History  ? Marital status: Divorced  ?  Spouse name: Not on file  ? Number of children: 3  ? Years of education: Not on file  ? Highest education level: Not on file  ?Occupational History  ? Occupation: retired  ?Tobacco Use  ? Smoking status: Former  ?  Types: Cigarettes  ?  Quit date: 01/26/1975  ?  Years since quitting: 46.9  ? Smokeless tobacco: Never  ?Vaping Use  ? Vaping Use: Never used  ?Substance and Sexual Activity  ? Alcohol use: No  ? Drug use: No  ? Sexual activity: Not Currently  ?  Birth control/protection: None  ?Other Topics Concern  ? Not on file  ?Social History Narrative  ? Lives alone -has a basement  ? One child 2 miles away - other 2 live out of state  ? ?Social Determinants of Health  ? ?Financial Resource Strain: Low Risk   ? Difficulty of Paying Living Expenses: Not hard at all  ?Food Insecurity: No Food Insecurity  ? Worried About Charity fundraiser in the Last Year: Never true  ? Ran Out of Food in the Last Year: Never true  ?Transportation Needs: No Transportation Needs  ? Lack of Transportation (Medical): No  ? Lack of Transportation (Non-Medical): No  ?Physical Activity: Insufficiently Active  ? Days of Exercise per Week: 3 days  ? Minutes of Exercise per Session: 30 min  ?Stress: No Stress Concern Present  ? Feeling of Stress : Not at all  ?Social Connections: Moderately Integrated  ? Frequency  of Communication with Friends and Family: More than three times a week  ? Frequency of Social Gatherings with Friends and Family: More than three times a week  ? Attends Religious Services: More than 4 times per year  ? Active Member of Clubs or Organizations: Yes  ? Attends Archivist Meetings: More than 4 times per year  ? Marital Status: Widowed  ?Intimate Partner Violence: Not At Risk  ? Fear of Current or Ex-Partner: No  ? Emotionally Abused: No  ? Physically Abused: No  ? Sexually Abused: No  ?  ? ?   ?  Objective:  ?  ?BP (!) 147/76   Pulse 75   Temp 97.9 ?F (36.6 ?C) (Temporal)   Ht '5\' 6"'$  (1.676 m)   Wt 137 lb 3.2 oz (62.2 kg)   SpO2 97%   BMI 22.14 kg/m?  ? ?Wt Readings from Last 3 Encounters:  ?12/18/21 137 lb 3.2 oz (62.2 kg)  ?12/10/21 137 lb 6 oz (62.3 kg)  ?12/05/21 139 lb 9.6 oz (63.3 kg)  ? ? ?Physical Exam ?Vitals reviewed.  ?Constitutional:   ?   General: She is not in acute distress. ?   Appearance: Normal appearance. She is normal weight. She is not ill-appearing, toxic-appearing or diaphoretic.  ?HENT:  ?   Head: Normocephalic and atraumatic.  ?Eyes:  ?   General: No scleral icterus.    ?   Right eye: No discharge.     ?   Left eye: No discharge.  ?   Conjunctiva/sclera: Conjunctivae normal.  ?Cardiovascular:  ?   Rate and Rhythm: Normal rate and regular rhythm.  ?   Heart sounds: Normal heart sounds. No murmur heard. ?  No friction rub. No gallop.  ?Pulmonary:  ?   Effort: Pulmonary effort is normal. No respiratory distress.  ?   Breath sounds: No stridor. Wheezing and rhonchi present. No rales.  ?Musculoskeletal:     ?   General: Normal range of motion.  ?   Cervical back: Normal range of motion.  ?Skin: ?   General: Skin is warm and dry.  ?   Capillary Refill: Capillary refill takes less than 2 seconds.  ?Neurological:  ?   General: No focal deficit present.  ?   Mental Status: She is alert and oriented to person, place, and time. Mental status is at baseline.  ?Psychiatric:      ?   Mood and Affect: Mood normal.     ?   Behavior: Behavior normal.     ?   Thought Content: Thought content normal.     ?   Judgment: Judgment normal.  ? ? ?No results found for: TSH ?Lab Results  ?[place]

## 2022-01-02 DIAGNOSIS — Z08 Encounter for follow-up examination after completed treatment for malignant neoplasm: Secondary | ICD-10-CM | POA: Diagnosis not present

## 2022-01-02 DIAGNOSIS — L821 Other seborrheic keratosis: Secondary | ICD-10-CM | POA: Diagnosis not present

## 2022-01-02 DIAGNOSIS — Z85828 Personal history of other malignant neoplasm of skin: Secondary | ICD-10-CM | POA: Diagnosis not present

## 2022-01-06 ENCOUNTER — Ambulatory Visit (INDEPENDENT_AMBULATORY_CARE_PROVIDER_SITE_OTHER): Payer: Medicare Other | Admitting: Emergency Medicine

## 2022-01-06 DIAGNOSIS — Z23 Encounter for immunization: Secondary | ICD-10-CM

## 2022-03-15 DIAGNOSIS — S70361A Insect bite (nonvenomous), right thigh, initial encounter: Secondary | ICD-10-CM | POA: Diagnosis not present

## 2022-03-31 ENCOUNTER — Telehealth: Payer: Self-pay | Admitting: Family Medicine

## 2022-03-31 NOTE — Telephone Encounter (Signed)
This will have to wait for [place] to return. Or check her desk.

## 2022-04-01 NOTE — Telephone Encounter (Signed)
Tried to contact patient, line was busy twice

## 2022-04-08 NOTE — Telephone Encounter (Signed)
I do not have anything in my in-basket for this patient.

## 2022-04-08 NOTE — Telephone Encounter (Signed)
Patient states that she already picked up this form.

## 2022-04-14 ENCOUNTER — Telehealth: Payer: Self-pay | Admitting: Family Medicine

## 2022-04-15 NOTE — Telephone Encounter (Signed)
Patient aware and verbalizes understanding. 

## 2022-04-15 NOTE — Telephone Encounter (Signed)
Did this last week.

## 2022-04-15 NOTE — Telephone Encounter (Signed)
Per Tye [place], it was faxed on 04/11/2022 and sent to be scanned to chart.

## 2022-04-17 ENCOUNTER — Other Ambulatory Visit: Payer: Self-pay | Admitting: *Deleted

## 2022-04-17 NOTE — Patient Outreach (Signed)
  Care Coordination   04/17/2022 Name: Loretta Miles MRN: [id number] DOB: 12/07/1930   Care Coordination Outreach Attempts:  An unsuccessful telephone outreach was attempted today to offer the patient information about available care coordination services as a benefit of their health plan.   Follow Up Plan:  Additional outreach attempts will be made to offer the patient care coordination information and services.   Encounter Outcome:  Pt. Request to Call Back  Care Coordination Interventions Activated:  No   Care Coordination Interventions:  No, not indicated    Emelia Loron RN, BSN [place] [phone] [email or web address]

## 2022-06-12 DIAGNOSIS — Z08 Encounter for follow-up examination after completed treatment for malignant neoplasm: Secondary | ICD-10-CM | POA: Diagnosis not present

## 2022-06-12 DIAGNOSIS — L821 Other seborrheic keratosis: Secondary | ICD-10-CM | POA: Diagnosis not present

## 2022-06-12 DIAGNOSIS — L820 Inflamed seborrheic keratosis: Secondary | ICD-10-CM | POA: Diagnosis not present

## 2022-06-12 DIAGNOSIS — Z85828 Personal history of other malignant neoplasm of skin: Secondary | ICD-10-CM | POA: Diagnosis not present

## 2022-06-20 DIAGNOSIS — N1831 Chronic kidney disease, stage 3a: Secondary | ICD-10-CM | POA: Diagnosis not present

## 2022-06-20 DIAGNOSIS — E785 Hyperlipidemia, unspecified: Secondary | ICD-10-CM | POA: Diagnosis not present

## 2022-06-20 DIAGNOSIS — I10 Essential (primary) hypertension: Secondary | ICD-10-CM | POA: Diagnosis not present

## 2022-06-23 ENCOUNTER — Ambulatory Visit: Payer: Self-pay | Admitting: *Deleted

## 2022-06-23 ENCOUNTER — Encounter: Payer: Self-pay | Admitting: *Deleted

## 2022-06-23 NOTE — Patient Outreach (Signed)
  Care Coordination   Initial Visit Note   06/23/2022  Name: Loretta Miles MRN: [id number] DOB: 05/13/1931  Loretta Miles is a 88 y.o. year old female who sees Loman Brooklyn, FNP for primary care. I spoke with Loretta Miles by phone today.  What matters to the patients health and wellness today?  No Interventions Identified.   SDOH assessments and interventions completed:  Yes.  SDOH Interventions Today    Flowsheet Row Most Recent Value  SDOH Interventions   Food Insecurity Interventions Intervention Not Indicated  Housing Interventions Intervention Not Indicated  Transportation Interventions Intervention Not Indicated  Utilities Interventions Intervention Not Indicated  Alcohol Usage Interventions Intervention Not Indicated (Score <7)  Financial Strain Interventions Intervention Not Indicated  Physical Activity Interventions Patient Refused  Stress Interventions Intervention Not Indicated  Social Connections Interventions Intervention Not Indicated     Care Coordination Interventions Activated:  Yes.   Care Coordination Interventions:  Yes, provided.   Follow up plan: No further intervention required.   Encounter Outcome:  Pt. Visit Completed.   Loretta Miles, BSW, MSW, LCSW  Licensed Education officer, [employer]  Mailing [place] [address], [place], [place] [address] Physical Address-[address] [place], [place] [address] Toll Free Main # [phone] Fax # [phone] Cell # [phone] [email or web address]

## 2022-06-23 NOTE — Patient Instructions (Signed)
Visit Information  Thank you for taking time to visit with me today. Please don't hesitate to contact me if I can be of assistance to you.   Please call the care guide team at [phone] if you need to cancel or reschedule your appointment.   If you are experiencing a Mental Health or Behavioral Health Crisis or need someone to talk to, please call the Suicide and Crisis Lifeline: 988 call the [place] National Suicide Prevention Lifeline: 1-800-273-8255 or TTY: 1-800-799-4 TTY (1-800-799-4889) to talk to a trained counselor call 1-800-273-TALK (toll free, 24 hour hotline) go to [facility] [address], [place] ([phone]) call the [place] Crisis Line: 800-939-9988 call 911  Patient verbalizes understanding of instructions and care plan provided today and agrees to view in MyChart. Active MyChart status and patient understanding of how to access instructions and care plan via MyChart confirmed with patient.     No further follow up required.  Ziana Heyliger, BSW, MSW, LCSW  Licensed Clinical Social Worker  [facility]  Mailing Address-[address], [place], [place] [address] Physical Address-[address], [place], [place] [address] Toll Free Main # [phone] Fax # [phone] Cell # [phone] Makhai [email or web address]            

## 2022-09-02 IMAGING — DX DG CHEST 2V
3 series · 3 of 3 positions shown · non-contrast
Comparison: 09/21/2016

CLINICAL DATA: Cough and shortness of breath.

EXAM:
CHEST - 2 VIEW

[chest pa (1 of 2)]
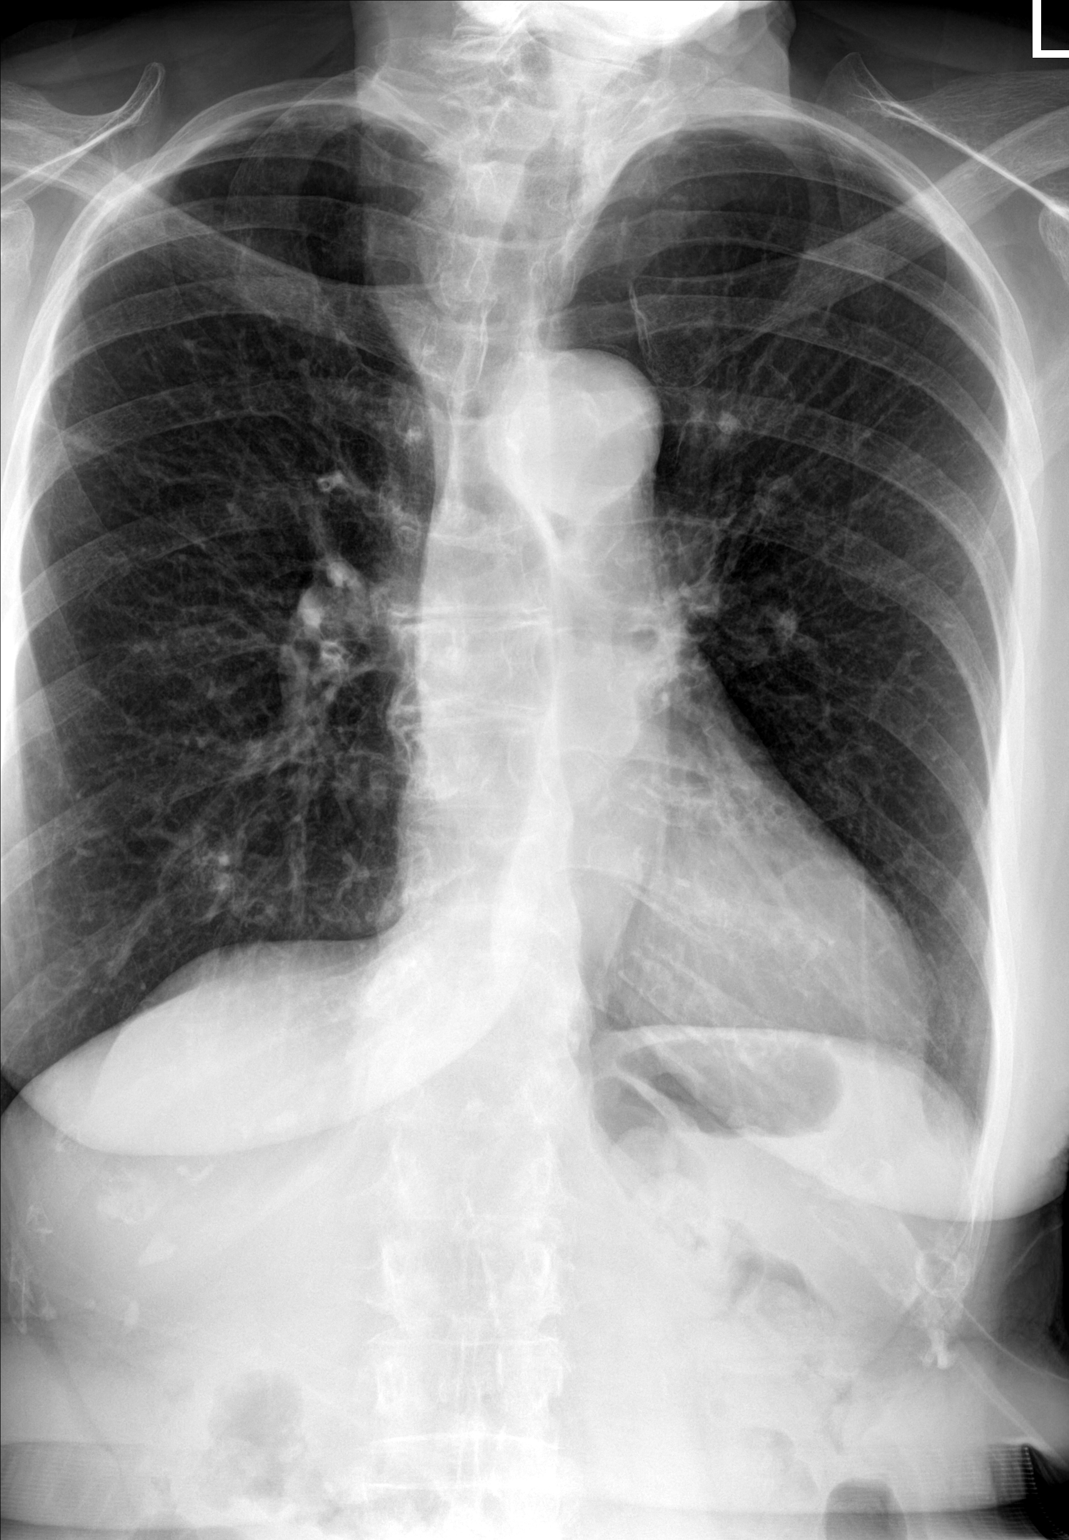

[chest pa (2 of 2)]
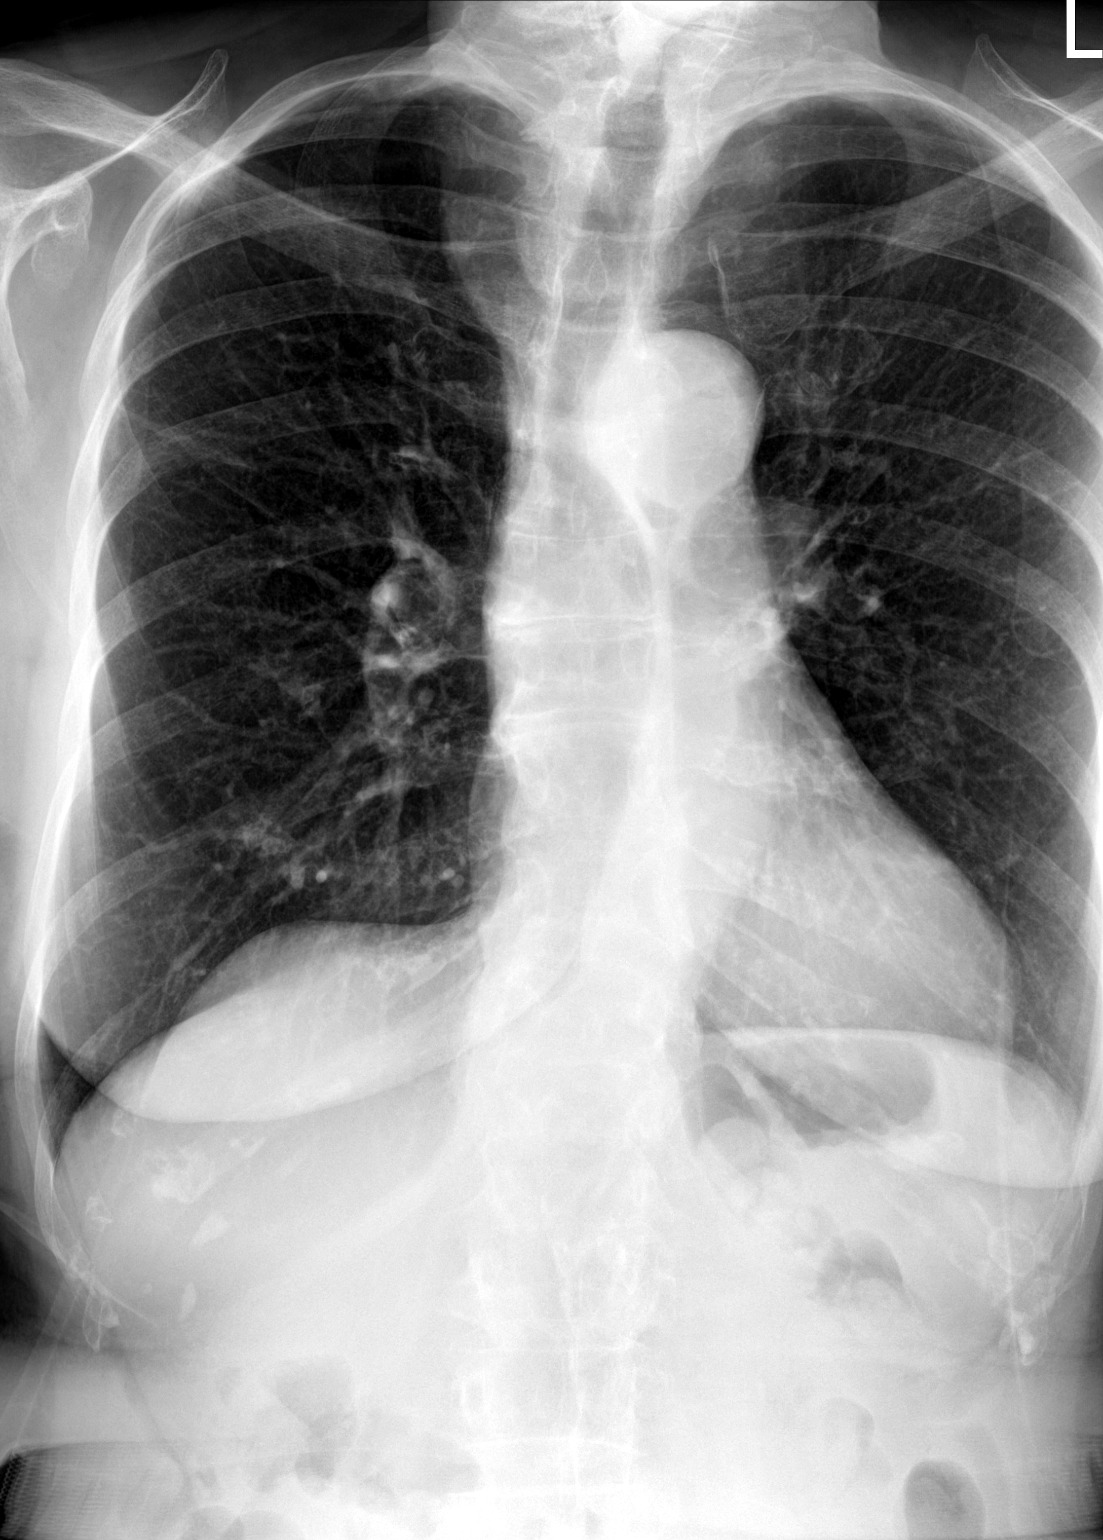

[chest lat]
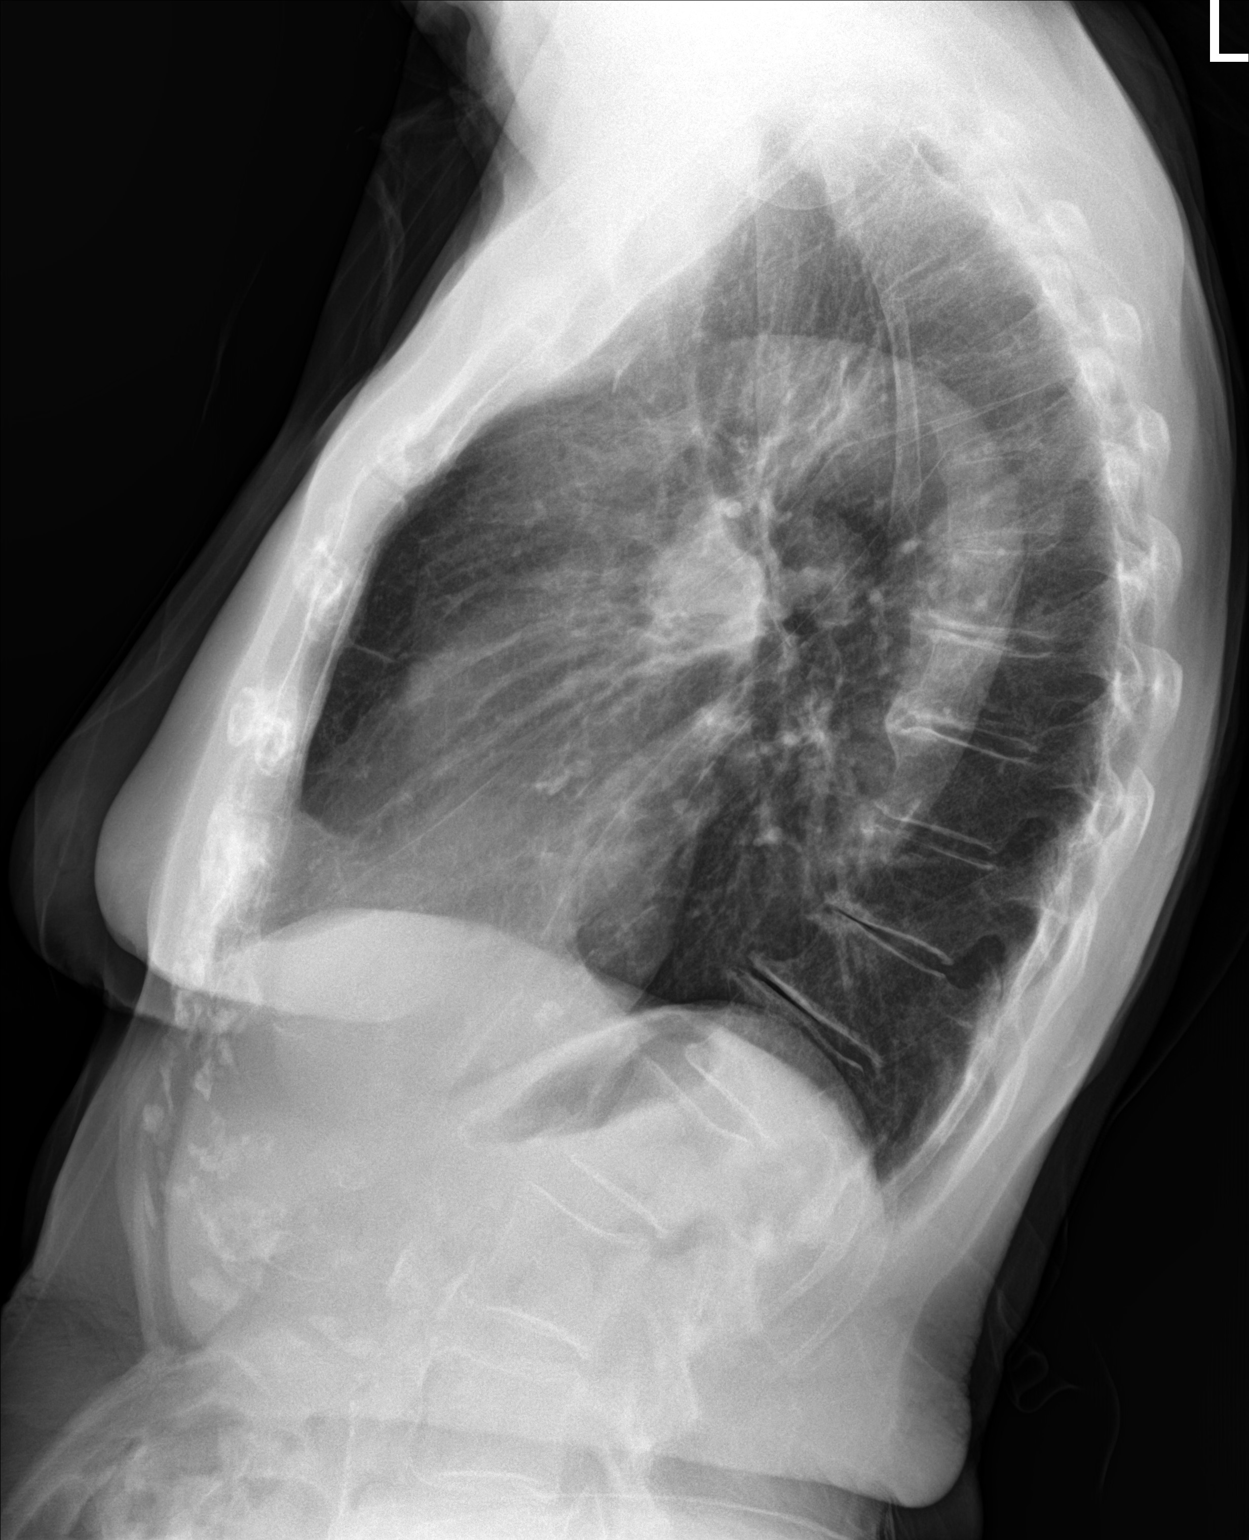

[3 of 3 positions shown; findings below may reference images not displayed]

FINDINGS: Grossly unchanged cardiac silhouette and mediastinal contours with
atherosclerotic plaque within a tortuous thoracic aorta. There is
persistent thickening of the right paratracheal stripe presumably
secondary to prominent vasculature. The lungs are hyperexpanded with
flattening of the bilateral hemidiaphragms. Redemonstrated linear
heterogeneous opacities/scar involving the peripheral aspect of the
right upper lung, similar to the 4346 examination. No new focal
airspace opacities. No pleural effusion or pneumothorax. No evidence
of edema. No acute osseous abnormalities. Stigmata of dish within
the thoracic spine. Mild-to-moderate multilevel thoracic spine DDD
IMPRESSION: Hyperexpanded lungs without superimposed acute cardiopulmonary
disease. Specifically, no focal airspace opacities to suggest
pneumonia.
